# Patient Record
Sex: Female | Born: 1990 | Race: White | Hispanic: No | Marital: Married | Smoking: Never smoker
Health system: Southern US, Community
[De-identification: ages and names within clinical notes are randomized; demographics above are authoritative.]

## PROBLEM LIST (undated history)

## (undated) DIAGNOSIS — F419 Anxiety disorder, unspecified: Secondary | ICD-10-CM

## (undated) DIAGNOSIS — E039 Hypothyroidism, unspecified: Secondary | ICD-10-CM

## (undated) DIAGNOSIS — Z23 Encounter for immunization: Secondary | ICD-10-CM

## (undated) HISTORY — PX: WISDOM TOOTH EXTRACTION: SHX21

## (undated) HISTORY — DX: Hypothyroidism, unspecified: E03.9

## (undated) HISTORY — DX: Encounter for immunization: Z23

---
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
## 2015-12-01 ENCOUNTER — Ambulatory Visit: Payer: Self-pay | Admitting: Physician Assistant

## 2015-12-05 ENCOUNTER — Ambulatory Visit (INDEPENDENT_AMBULATORY_CARE_PROVIDER_SITE_OTHER): Payer: BLUE CROSS/BLUE SHIELD | Admitting: Physician Assistant

## 2015-12-05 ENCOUNTER — Encounter: Payer: Self-pay | Admitting: Physician Assistant

## 2015-12-05 VITALS — BP 110/70 | HR 91 | Temp 98.1°F | Resp 16 | Ht 61.0 in | Wt 138.8 lb

## 2015-12-05 DIAGNOSIS — L309 Dermatitis, unspecified: Secondary | ICD-10-CM | POA: Diagnosis not present

## 2015-12-05 DIAGNOSIS — Z136 Encounter for screening for cardiovascular disorders: Secondary | ICD-10-CM

## 2015-12-05 DIAGNOSIS — Z7189 Other specified counseling: Secondary | ICD-10-CM | POA: Diagnosis not present

## 2015-12-05 DIAGNOSIS — Z13220 Encounter for screening for lipoid disorders: Secondary | ICD-10-CM | POA: Diagnosis not present

## 2015-12-05 DIAGNOSIS — Z7689 Persons encountering health services in other specified circumstances: Secondary | ICD-10-CM

## 2015-12-05 MED ORDER — TRIAMCINOLONE ACETONIDE 0.1 % EX CREA: 1.0000 "application " | TOPICAL_CREAM | Freq: Two times a day (BID) | CUTANEOUS | Status: DC

## 2015-12-05 NOTE — Patient Instructions (Signed)
Eczema Eczema, also called atopic dermatitis, is a skin disorder that causes inflammation of the skin. It causes a red rash and dry, scaly skin. The skin becomes very itchy. Eczema is generally worse during the cooler winter months and often improves with the warmth of summer. Eczema usually starts showing signs in infancy. Some children outgrow eczema, but it may last through adulthood.  CAUSES  The exact cause of eczema is not known, but it appears to run in families. People with eczema often have a family history of eczema, allergies, asthma, or hay fever. Eczema is not contagious. Flare-ups of the condition may be caused by:   Contact with something you are sensitive or allergic to.   Stress. SIGNS AND SYMPTOMS  Dry, scaly skin.   Red, itchy rash.   Itchiness. This may occur before the skin rash and may be very intense.  DIAGNOSIS  The diagnosis of eczema is usually made based on symptoms and medical history. TREATMENT  Eczema cannot be cured, but symptoms usually can be controlled with treatment and other strategies. A treatment plan might include:  Controlling the itching and scratching.   Use over-the-counter antihistamines as directed for itching. This is especially useful at night when the itching tends to be worse.   Use over-the-counter steroid creams as directed for itching.   Avoid scratching. Scratching makes the rash and itching worse. It may also result in a skin infection (impetigo) due to a break in the skin caused by scratching.   Keeping the skin well moisturized with creams every day. This will seal in moisture and help prevent dryness. Lotions that contain alcohol and water should be avoided because they can dry the skin.   Limiting exposure to things that you are sensitive or allergic to (allergens).   Recognizing situations that cause stress.   Developing a plan to manage stress.  HOME CARE INSTRUCTIONS   Only take over-the-counter or  prescription medicines as directed by your health care provider.   Do not use anything on the skin without checking with your health care provider.   Keep baths or showers short (5 minutes) in warm (not hot) water. Use mild cleansers for bathing. These should be unscented. You may add nonperfumed bath oil to the bath water. It is best to avoid soap and bubble bath.   Immediately after a bath or shower, when the skin is still damp, apply a moisturizing ointment to the entire body. This ointment should be a petroleum ointment. This will seal in moisture and help prevent dryness. The thicker the ointment, the better. These should be unscented.   Keep fingernails cut short. Children with eczema may need to wear soft gloves or mittens at night after applying an ointment.   Dress in clothes made of cotton or cotton blends. Dress lightly, because heat increases itching.   A child with eczema should stay away from anyone with fever blisters or cold sores. The virus that causes fever blisters (herpes simplex) can cause a serious skin infection in children with eczema. SEEK MEDICAL CARE IF:   Your itching interferes with sleep.   Your rash gets worse or is not better within 1 week after starting treatment.   You see pus or soft yellow scabs in the rash area.   You have a fever.   You have a rash flare-up after contact with someone who has fever blisters.    This information is not intended to replace advice given to you by your health care   provider. Make sure you discuss any questions you have with your health care provider.   Document Released: 11/05/2000 Document Revised: 08/29/2013 Document Reviewed: 06/11/2013 Elsevier Interactive Patient Education 2016 Elsevier Inc.  

## 2015-12-05 NOTE — Progress Notes (Signed)
Patient: Angela Nunez, Female    DOB: [birth year], 25 y.o.   MRN: [id number] Visit Date: 12/05/2015  Today's Provider: Margaretann Loveless, PA-C   Chief Complaint  Patient presents with  . Establish Care   Subjective:    Annual physical exam Angela Nunez is a 25 y.o. female who presents today for establishing care as a new patient  She feels well. She reports not exercising. She reports she is sleeping well. She is a Holiday representative at [employer]. Patient is concern a rash on her hand. Patient wants blood work done today. Patient had her pap done 11/18/15 and breast exam at [facility]. Patient got her influenza vaccine at [facility]. 08/2015.   -----------------------------------------------------------------   Review of Systems  Constitutional: Negative.   HENT: Negative.   Eyes: Negative.   Respiratory: Negative.   Cardiovascular: Negative.   Gastrointestinal: Negative.   Endocrine: Negative.   Genitourinary: Negative.   Musculoskeletal: Negative.   Skin: Positive for rash.  Allergic/Immunologic: Negative.   Neurological: Negative.   Hematological: Negative.   Psychiatric/Behavioral: Negative.     Social History      She  reports that she has never smoked. She has never used smokeless tobacco. She reports that she drinks alcohol. She reports that she does not use illicit drugs.       Social History   Social History  . Marital Status: Single    Spouse Name: N/A  . Number of Children: N/A  . Years of Education: N/A   Social History Main Topics  . Smoking status: Never Smoker   . Smokeless tobacco: Never Used  . Alcohol Use: Yes     Comment: Once or twice a month  . Drug Use: No  . Sexual Activity: Not Asked   Other Topics Concern  . None   Social History Narrative  . None    History reviewed. No pertinent past medical history.   There are no active problems to display for this patient.   History reviewed. No  pertinent past surgical history.  Family History        Family Status  Relation Status Death Age  . Mother Alive   . Father Alive         Her family history is not on file.    No Known Allergies  Previous Medications   SPRINTEC 28 0.25-35 MG-MCG TABLET    Take 1 tablet by mouth daily.    Patient Care Team: Margaretann Loveless, PA-C as PCP - General (Family Medicine)     Objective:   Vitals: BP 110/70 mmHg  Pulse 91  Temp(Src) 98.1 F (36.7 C) (Oral)  Resp 16  Ht 5\' 1"  (1.549 m)  Wt 138 lb 12.8 oz (62.959 kg)  BMI 26.24 kg/m2  LMP 11/19/2015   Physical Exam  Constitutional: She is oriented to person, place, and time. She appears well-developed and well-nourished. No distress.  HENT:  Head: Normocephalic and atraumatic.  Right Ear: External ear normal.  Left Ear: External ear normal.  Nose: Nose normal.  Mouth/Throat: Oropharynx is clear and moist. No oropharyngeal exudate.  Eyes: Conjunctivae and EOM are normal. Pupils are equal, round, and reactive to light. Right eye exhibits no discharge. Left eye exhibits no discharge. No scleral icterus.  Neck: Normal range of motion. Neck supple. No JVD present. No tracheal deviation present. No thyromegaly present.  Cardiovascular: Normal rate, regular rhythm, normal heart sounds and intact distal pulses.  Exam reveals no gallop and no friction rub.   No murmur heard. Pulmonary/Chest: Effort normal and breath sounds normal. No respiratory distress. She has no wheezes. She has no rales. She exhibits no tenderness.  Abdominal: Soft. Bowel sounds are normal. She exhibits no distension and no mass. There is no tenderness. There is no rebound and no guarding.  Musculoskeletal: Normal range of motion. She exhibits no edema or tenderness.  Lymphadenopathy:    She has no cervical adenopathy.  Neurological: She is alert and oriented to person, place, and time.  Skin: Skin is warm and dry. Rash noted. She is not diaphoretic.  Areas of  red, scaly patches that itch on occasion located over MTP joint 3rd finger left hand, MTP joint 3rd finger right hand, and left foot.  Psychiatric: She has a normal mood and affect. Her behavior is normal. Judgment and thought content normal.  Vitals reviewed.    Depression Screen No flowsheet data found.    Assessment & Plan:     Routine Health Maintenance and Physical Exam  1. Establishing care with new doctor, encounter for Pap and [facility]. Physical exam today was normal with exception of eczema. Will check labs as below.  I will follow up with her pending lab results. She is to call the office in the meantime if she has any acute issues, questions or concerns. - CBC With Differential - TSH - Comprehensive Metabolic Panel (CMET)  2. Encounter for lipid screening for cardiovascular disease Family history of high cholesterol.  Will check labs as below. I will follow up with her pending lab results.  - Lipid panel  3. Eczema Will give triamcinolone cream for her to use with flares.  Advised she can use for two weeks at a time. She is to use a good lotion in between such as lubriderm or eucerin. She is to call the office if symptoms worsen. - triamcinolone cream (KENALOG) 0.1 %; Apply 1 application topically 2 (two) times daily.  Dispense: 80 g; Refill: 0   Exercise Activities and Dietary recommendations Goals    None      Immunization History  Administered Date(s) Administered  . Influenza-Unspecified 09/03/2015    Health Maintenance  Topic Date Due  . HIV Screening  05/14/2008  . TETANUS/TDAP  05/14/2012  . PAP SMEAR  05/14/2014  . INFLUENZA VACCINE  06/22/2016      Discussed health benefits of physical activity, and encouraged her to engage in regular exercise appropriate for her age and condition.    --------------------------------------------------------------------

## 2015-12-06 LAB — COMPREHENSIVE METABOLIC PANEL
A/G RATIO: 1.5 (ref 1.1–2.5)
ALBUMIN: 4.5 g/dL (ref 3.5–5.5)
ALK PHOS: 80 IU/L (ref 39–117)
ALT: 17 IU/L (ref 0–32)
AST: 20 IU/L (ref 0–40)
BILIRUBIN TOTAL: 0.8 mg/dL (ref 0.0–1.2)
BUN / CREAT RATIO: 11 (ref 8–20)
BUN: 10 mg/dL (ref 6–20)
CO2: 25 mmol/L (ref 18–29)
Calcium: 10.3 mg/dL — ABNORMAL HIGH (ref 8.7–10.2)
Chloride: 101 mmol/L (ref 96–106)
Creatinine, Ser: 0.9 mg/dL (ref 0.57–1.00)
GFR calc Af Amer: 105 mL/min/{1.73_m2} (ref >59)
GFR calc non Af Amer: 91 mL/min/{1.73_m2} (ref >59)
GLOBULIN, TOTAL: 3.1 g/dL (ref 1.5–4.5)
Glucose: 87 mg/dL (ref 65–99)
Potassium: 5.3 mmol/L — ABNORMAL HIGH (ref 3.5–5.2)
Sodium: 143 mmol/L (ref 134–144)
Total Protein: 7.6 g/dL (ref 6.0–8.5)

## 2015-12-06 LAB — CBC WITH DIFFERENTIAL
BASOS ABS: 0 10*3/uL (ref 0.0–0.2)
Basos: 1 %
EOS (ABSOLUTE): 0.2 10*3/uL (ref 0.0–0.4)
EOS: 2 %
HEMATOCRIT: 41.4 % (ref 34.0–46.6)
HEMOGLOBIN: 14.4 g/dL (ref 11.1–15.9)
IMMATURE GRANS (ABS): 0 10*3/uL (ref 0.0–0.1)
IMMATURE GRANULOCYTES: 0 %
LYMPHS: 32 %
Lymphocytes Absolute: 2.7 10*3/uL (ref 0.7–3.1)
MCH: 29.1 pg (ref 26.6–33.0)
MCHC: 34.8 g/dL (ref 31.5–35.7)
MCV: 84 fL (ref 79–97)
MONOCYTES: 6 %
Monocytes Absolute: 0.5 10*3/uL (ref 0.1–0.9)
NEUTROS ABS: 5.2 10*3/uL (ref 1.4–7.0)
Neutrophils: 59 %
RBC: 4.95 x10E6/uL (ref 3.77–5.28)
RDW: 12.2 % — ABNORMAL LOW (ref 12.3–15.4)
WBC: 8.7 10*3/uL (ref 3.4–10.8)

## 2015-12-06 LAB — LIPID PANEL
CHOL/HDL RATIO: 3.7 ratio (ref 0.0–4.4)
CHOLESTEROL TOTAL: 245 mg/dL — AB (ref 100–199)
HDL: 66 mg/dL (ref >39)
LDL CALC: 145 mg/dL — AB (ref 0–99)
TRIGLYCERIDES: 169 mg/dL — AB (ref 0–149)
VLDL CHOLESTEROL CAL: 34 mg/dL (ref 5–40)

## 2015-12-06 LAB — TSH: TSH: 5.56 u[IU]/mL — ABNORMAL HIGH (ref 0.450–4.500)

## 2015-12-08 ENCOUNTER — Telehealth: Payer: Self-pay

## 2015-12-08 NOTE — Telephone Encounter (Signed)
-----   Message from Margaretann LovelessJennifer M Burnette, PA-C sent at 12/08/2015  8:38 AM EST ----- Cholesterol was slightly elevated.  This could be because the lab was non-fasting.  I do recommend watching your diet and trying to limit high fat, high cholesterol foods. Also making sure to stay physically active with adding moderate exercise 30-40 minutes 3-4 days per week. You could also add fish oil 1200 mg twice daily or red yeast rice as a natural supplement to decrease cholesterol. Also your TSH was slightly elevated which could cause some fatigue.  It could have been a fluke reading as well as TSH has a tendency to change quickly.  I do recommend retesting a complete thyroid panel in 8-12 weeks.  All other labs can be checked in one year.

## 2015-12-08 NOTE — Telephone Encounter (Signed)
LMTCB

## 2015-12-08 NOTE — Telephone Encounter (Signed)
Patient advised as directed below. Per patient will call later to schedule appointment.  Thanks, -Kenyia Wambolt

## 2016-02-05 ENCOUNTER — Ambulatory Visit (INDEPENDENT_AMBULATORY_CARE_PROVIDER_SITE_OTHER): Payer: BLUE CROSS/BLUE SHIELD | Admitting: Physician Assistant

## 2016-02-05 ENCOUNTER — Encounter: Payer: Self-pay | Admitting: Physician Assistant

## 2016-02-05 VITALS — BP 112/60 | Temp 98.1°F | Resp 16 | Ht 61.0 in | Wt 143.0 lb

## 2016-02-05 DIAGNOSIS — E039 Hypothyroidism, unspecified: Secondary | ICD-10-CM | POA: Diagnosis not present

## 2016-02-05 DIAGNOSIS — R7989 Other specified abnormal findings of blood chemistry: Secondary | ICD-10-CM | POA: Diagnosis not present

## 2016-02-05 NOTE — Patient Instructions (Signed)

## 2016-02-05 NOTE — Progress Notes (Addendum)
Patient ID: Angela Nunez, female   DOB: 01-16-1991, 25 y.o.   MRN: [id number]       Patient: Angela Nunez Female    DOB: 01-16-1991   24 y.o.   MRN: [id number] Visit Date: 02/05/2016  Today's Provider: Margaretann LovelessJennifer M Jesper Stirewalt, PA-C   Chief Complaint  Patient presents with  . Thyroid Problem    6 week F/U.    Subjective:    Thyroid Problem Presents for follow-up visit. Symptoms include fatigue. Patient reports no diaphoresis. Past treatments include nothing. There are no known risk factors.  Patient is here to F/U on elevated TSH that was drawn on 12/08/15. Patient reports that she still feels fatigued. However, it is not more than her "usual". Patient denies any hair loss, anxiety, depressed mood, or weight changes.      No Known Allergies Previous Medications   SPRINTEC 28 0.25-35 MG-MCG TABLET    Take 1 tablet by mouth daily.   TRIAMCINOLONE CREAM (KENALOG) 0.1 %    Apply 1 application topically 2 (two) times daily.    Review of Systems  Constitutional: Positive for fatigue. Negative for fever, chills, diaphoresis, activity change, appetite change and unexpected weight change.  Respiratory: Negative.   Cardiovascular: Negative.   Endocrine: Negative.   Neurological: Negative.   Psychiatric/Behavioral: Negative.     Social History  Substance Use Topics  . Smoking status: Never Smoker   . Smokeless tobacco: Never Used  . Alcohol Use: Yes     Comment: Once or twice a month   Objective:   There were no vitals taken for this visit.  Physical Exam  Constitutional: She appears well-developed and well-nourished. No distress.  Neck: Normal range of motion. Neck supple. No JVD present. No tracheal deviation present. No thyromegaly present.  Cardiovascular: Normal rate, regular rhythm and normal heart sounds.  Exam reveals no gallop and no friction rub.   No murmur heard. Pulmonary/Chest: Effort normal and breath sounds normal. No respiratory distress. She has no  wheezes. She has no rales.  Lymphadenopathy:    She has no cervical adenopathy.  Skin: She is not diaphoretic.  Vitals reviewed.       Assessment & Plan:     1. Abnormal TSH Will recheck thyroid panel as below. If TSH still abnormal will start levothyroxine and recheck in 3 months. - Thyroid Panel With TSH  Addendum: 1. Hypothyroidism, unspecified hypothyroidism type TSH is still elevated and has worsened. Will add levothyroxine and recheck in 3 months. - levothyroxine (SYNTHROID, LEVOTHROID) 25 MCG tablet; Take 1 tablet (25 mcg total) by mouth daily before breakfast.  Dispense: 90 tablet; Refill: 0       Margaretann LovelessJennifer M Shenelle Klas, PA-C  [facility]

## 2016-02-06 ENCOUNTER — Telehealth: Payer: Self-pay | Admitting: Physician Assistant

## 2016-02-06 DIAGNOSIS — Z8639 Personal history of other endocrine, nutritional and metabolic disease: Secondary | ICD-10-CM | POA: Insufficient documentation

## 2016-02-06 DIAGNOSIS — E039 Hypothyroidism, unspecified: Secondary | ICD-10-CM

## 2016-02-06 LAB — THYROID PANEL WITH TSH
Free Thyroxine Index: 1.8 (ref 1.2–4.9)
T3 UPTAKE RATIO: 18 % — AB (ref 24–39)
T4, Total: 10.1 ug/dL (ref 4.5–12.0)
TSH: 6.94 u[IU]/mL — ABNORMAL HIGH (ref 0.450–4.500)

## 2016-02-06 MED ORDER — LEVOTHYROXINE SODIUM 25 MCG PO TABS: 25.0000 ug | ORAL_TABLET | Freq: Every day | ORAL | Status: DC

## 2016-02-06 NOTE — Addendum Note (Signed)
Addended by: Margaretann LovelessBURNETTE, Hadlee Burback M on: 02/06/2016 10:07 AM   Modules accepted: Orders

## 2016-02-06 NOTE — Telephone Encounter (Signed)
Pt advised of lab results-aa 

## 2016-02-06 NOTE — Progress Notes (Signed)
LMTCB

## 2016-02-06 NOTE — Telephone Encounter (Signed)
Pt is returning call.  RU#[phone]/[id number]#[phone]/MW

## 2016-04-30 ENCOUNTER — Telehealth: Payer: Self-pay | Admitting: Physician Assistant

## 2016-04-30 DIAGNOSIS — E039 Hypothyroidism, unspecified: Secondary | ICD-10-CM

## 2016-04-30 MED ORDER — LEVOTHYROXINE SODIUM 25 MCG PO TABS: 25.0000 ug | ORAL_TABLET | Freq: Every day | ORAL | Status: DC

## 2016-04-30 NOTE — Telephone Encounter (Signed)
Pt is leaving to go out of town and will gone until the 16 th.  She will run out of her thyroid medication on the 17 th.  She has an appt to see you for her thyroid on Wednesday the 21st.  She wants to know will it be ok to miss those days of medication or does she need to get a refill before she comes back in.  If she does just call into [facility] and she will pick it up next Saturday when she gets back.  [facility] [address]  Thanks, Barth Kirkseri

## 2016-04-30 NOTE — Telephone Encounter (Signed)
Pt informed via VM

## 2016-04-30 NOTE — Telephone Encounter (Signed)
I will send in refill

## 2016-05-07 ENCOUNTER — Ambulatory Visit: Payer: BLUE CROSS/BLUE SHIELD | Admitting: Physician Assistant

## 2016-05-12 ENCOUNTER — Encounter: Payer: Self-pay | Admitting: Physician Assistant

## 2016-05-12 ENCOUNTER — Ambulatory Visit (INDEPENDENT_AMBULATORY_CARE_PROVIDER_SITE_OTHER): Payer: BLUE CROSS/BLUE SHIELD | Admitting: Physician Assistant

## 2016-05-12 VITALS — BP 120/82 | HR 90 | Temp 98.1°F | Resp 16 | Wt 145.8 lb

## 2016-05-12 DIAGNOSIS — E039 Hypothyroidism, unspecified: Secondary | ICD-10-CM | POA: Diagnosis not present

## 2016-05-12 NOTE — Patient Instructions (Signed)

## 2016-05-12 NOTE — Progress Notes (Signed)
       Patient: Angela Nunez Female    DOB: 12/30/1990   24 y.o.   MRN: [id number] Visit Date: 05/12/2016  Today's Provider: Margaretann LovelessJennifer M Burnette, PA-C   Chief Complaint  Patient presents with  . Follow-up    Hypothyroidism   Subjective:    HPI  Hypothyroid, follow-up:  TSH  Date Value Ref Range Status  02/05/2016 6.940* 0.450 - 4.500 uIU/mL Final  12/05/2015 5.560* 0.450 - 4.500 uIU/mL Final   Wt Readings from Last 3 Encounters:  05/12/16 145 lb 12.8 oz (66.134 kg)  02/05/16 143 lb (64.864 kg)  12/05/15 138 lb 12.8 oz (62.959 kg)    She was last seen for hypothyroid 3 months ago.  Management since that visit includes Levothyroxine 25 MCG. She reports good compliance with treatment. She is not having side effects.  She is not exercising. She is experiencing none She denies change in energy level, diarrhea, heat / cold intolerance, nervousness, palpitations and weight changes Weight trend: fluctuating a bit  ------------------------------------------------------------------------     No Known Allergies Current Meds  Medication Sig  . levothyroxine (SYNTHROID, LEVOTHROID) 25 MCG tablet Take 1 tablet (25 mcg total) by mouth daily before breakfast.  . SPRINTEC 28 0.25-35 MG-MCG tablet Take 1 tablet by mouth daily.  Marland Kitchen. triamcinolone cream (KENALOG) 0.1 % Apply 1 application topically 2 (two) times daily.    Review of Systems  Constitutional: Negative for fatigue.  Respiratory: Negative for chest tightness and shortness of breath.   Cardiovascular: Negative for chest pain, palpitations and leg swelling.  Endocrine: Negative for cold intolerance and heat intolerance.  Neurological: Negative for weakness.    Social History  Substance Use Topics  . Smoking status: Never Smoker   . Smokeless tobacco: Never Used  . Alcohol Use: Yes     Comment: Once or twice a month   Objective:   BP 120/82 mmHg  Pulse 90  Temp(Src) 98.1 F (36.7 C) (Oral)  Resp 16  Wt  145 lb 12.8 oz (66.134 kg)  LMP 05/05/2016  Physical Exam  Constitutional: She appears well-developed and well-nourished. No distress.  Neck: Normal range of motion. Neck supple. No tracheal deviation present. No thyromegaly present.  Cardiovascular: Normal rate, regular rhythm and normal heart sounds.  Exam reveals no gallop and no friction rub.   No murmur heard. Pulmonary/Chest: Effort normal and breath sounds normal. No respiratory distress. She has no wheezes. She has no rales.  Lymphadenopathy:    She has no cervical adenopathy.  Skin: She is not diaphoretic.  Vitals reviewed.       Assessment & Plan:     1. Hypothyroidism, unspecified hypothyroidism type Will recheck labs and f/u pending lab results. If stable will continue levothyroxine 25mcg. I will see her back in 6 months to recheck. - Thyroid Panel With TSH  The entirety of the information documented in the History of Present Illness, Review of Systems and Physical Exam were personally obtained by me. Portions of this information were initially documented by Angela Nunez, CMA and reviewed by me for thoroughness and accuracy.       Margaretann LovelessJennifer M Burnette, PA-C  [facility]

## 2016-05-13 LAB — THYROID PANEL WITH TSH
Free Thyroxine Index: 1.9 (ref 1.2–4.9)
T3 Uptake Ratio: 20 % — ABNORMAL LOW (ref 24–39)
T4 TOTAL: 9.7 ug/dL (ref 4.5–12.0)
TSH: 2.02 u[IU]/mL (ref 0.450–4.500)

## 2016-06-05 ENCOUNTER — Other Ambulatory Visit: Payer: Self-pay | Admitting: Physician Assistant

## 2016-06-05 DIAGNOSIS — E039 Hypothyroidism, unspecified: Secondary | ICD-10-CM

## 2016-06-07 MED ORDER — LEVOTHYROXINE SODIUM 25 MCG PO TABS: 25.0000 ug | ORAL_TABLET | Freq: Every day | ORAL | Status: DC

## 2016-07-20 DIAGNOSIS — H52222 Regular astigmatism, left eye: Secondary | ICD-10-CM | POA: Diagnosis not present

## 2016-09-17 DIAGNOSIS — Z23 Encounter for immunization: Secondary | ICD-10-CM | POA: Diagnosis not present

## 2016-11-08 ENCOUNTER — Other Ambulatory Visit: Payer: Self-pay

## 2016-11-08 DIAGNOSIS — E039 Hypothyroidism, unspecified: Secondary | ICD-10-CM

## 2016-11-08 MED ORDER — LEVOTHYROXINE SODIUM 25 MCG PO TABS: 25.0000 ug | ORAL_TABLET | Freq: Every day | ORAL | 6 refills | Status: DC

## 2016-11-08 NOTE — Telephone Encounter (Signed)
Pharmacy requesting refill. NOTE: can medication be changed for [facility] to [employer]

## 2016-12-22 DIAGNOSIS — Z309 Encounter for contraceptive management, unspecified: Secondary | ICD-10-CM | POA: Diagnosis not present

## 2016-12-22 DIAGNOSIS — Z124 Encounter for screening for malignant neoplasm of cervix: Secondary | ICD-10-CM | POA: Diagnosis not present

## 2016-12-22 DIAGNOSIS — Z01419 Encounter for gynecological examination (general) (routine) without abnormal findings: Secondary | ICD-10-CM | POA: Diagnosis not present

## 2016-12-22 DIAGNOSIS — Z3041 Encounter for surveillance of contraceptive pills: Secondary | ICD-10-CM | POA: Diagnosis not present

## 2016-12-22 DIAGNOSIS — Z113 Encounter for screening for infections with a predominantly sexual mode of transmission: Secondary | ICD-10-CM | POA: Diagnosis not present

## 2016-12-22 LAB — HM PAP SMEAR: HM PAP: NORMAL

## 2017-02-10 DIAGNOSIS — L309 Dermatitis, unspecified: Secondary | ICD-10-CM | POA: Diagnosis not present

## 2017-07-01 ENCOUNTER — Ambulatory Visit (INDEPENDENT_AMBULATORY_CARE_PROVIDER_SITE_OTHER): Payer: BLUE CROSS/BLUE SHIELD | Admitting: Physician Assistant

## 2017-07-01 ENCOUNTER — Encounter: Payer: Self-pay | Admitting: Physician Assistant

## 2017-07-01 VITALS — BP 140/80 | HR 101 | Temp 98.0°F | Resp 16 | Ht 61.0 in | Wt 158.0 lb

## 2017-07-01 DIAGNOSIS — E039 Hypothyroidism, unspecified: Secondary | ICD-10-CM | POA: Diagnosis not present

## 2017-07-01 DIAGNOSIS — Z20820 Contact with and (suspected) exposure to varicella: Secondary | ICD-10-CM | POA: Diagnosis not present

## 2017-07-01 DIAGNOSIS — Z23 Encounter for immunization: Secondary | ICD-10-CM

## 2017-07-01 MED ORDER — LEVOTHYROXINE SODIUM 25 MCG PO TABS: 25.0000 ug | ORAL_TABLET | Freq: Every day | ORAL | 1 refills | Status: DC

## 2017-07-01 NOTE — Patient Instructions (Signed)
Hypothyroidism Hypothyroidism is a disorder of the thyroid. The thyroid is a large gland that is located in the lower front of the neck. The thyroid releases hormones that control how the body works. With hypothyroidism, the thyroid does not make enough of these hormones. What are the causes? Causes of hypothyroidism may include:  Viral infections.  Pregnancy.  Your own defense system (immune system) attacking your thyroid.  Certain medicines.  Birth defects.  Past radiation treatments to your head or neck.  Past treatment with radioactive iodine.  Past surgical removal of part or all of your thyroid.  Problems with the gland that is located in the center of your brain (pituitary).  What are the signs or symptoms? Signs and symptoms of hypothyroidism may include:  Feeling as though you have no energy (lethargy).  Inability to tolerate cold.  Weight gain that is not explained by a change in diet or exercise habits.  Dry skin.  Coarse hair.  Menstrual irregularity.  Slowing of thought processes.  Constipation.  Sadness or depression.  How is this diagnosed? Your health care provider may diagnose hypothyroidism with blood tests and ultrasound tests. How is this treated? Hypothyroidism is treated with medicine that replaces the hormones that your body does not make. After you begin treatment, it may take several weeks for symptoms to go away. Follow these instructions at home:  Take medicines only as directed by your health care provider.  If you start taking any new medicines, tell your health care provider.  Keep all follow-up visits as directed by your health care provider. This is important. As your condition improves, your dosage needs may change. You will need to have blood tests regularly so that your health care provider can watch your condition. Contact a health care provider if:  Your symptoms do not get better with treatment.  You are taking thyroid  replacement medicine and: ? You sweat excessively. ? You have tremors. ? You feel anxious. ? You lose weight rapidly. ? You cannot tolerate heat. ? You have emotional swings. ? You have diarrhea. ? You feel weak. Get help right away if:  You develop chest pain.  You develop an irregular heartbeat.  You develop a rapid heartbeat. This information is not intended to replace advice given to you by your health care provider. Make sure you discuss any questions you have with your health care provider. Document Released: 11/08/2005 Document Revised: 04/15/2016 Document Reviewed: 03/26/2014 Elsevier Interactive Patient Education  2017 Elsevier Inc.  

## 2017-07-01 NOTE — Progress Notes (Signed)
Patient: Angela Nunez Female    DOB: [birth year]   26 y.o.   MRN: [id number] Visit Date: 07/01/2017  Today's Provider: Margaretann Loveless, PA-C   Chief Complaint  Patient presents with  . Hypothyroidism   Subjective:    HPI  Hypothyroid, follow-up:  TSH  Date Value Ref Range Status  05/12/2016 2.020 0.450 - 4.500 uIU/mL Final  02/05/2016 6.940 (H) 0.450 - 4.500 uIU/mL Final  12/05/2015 5.560 (H) 0.450 - 4.500 uIU/mL Final   Wt Readings from Last 3 Encounters:  07/01/17 158 lb (71.7 kg)  05/12/16 145 lb 12.8 oz (66.1 kg)  02/05/16 143 lb (64.9 kg)    She was last seen for hypothyroid 1 years ago.  Management since that visit includes check labs. She reports excellent compliance with treatment. She is not having side effects.  She is not exercising. She is experiencing none She denies change in energy level, heat / cold intolerance and palpitations Weight trend: stable ------------------------------------------------------------------------     No Known Allergies   Current Outpatient Prescriptions:  .  levothyroxine (SYNTHROID, LEVOTHROID) 25 MCG tablet, Take 1 tablet (25 mcg total) by mouth daily before breakfast., Disp: 30 tablet, Rfl: 6 .  SPRINTEC 28 0.25-35 MG-MCG tablet, Take 1 tablet by mouth daily., Disp: , Rfl: 0  Review of Systems  Constitutional: Negative.   Respiratory: Negative.   Cardiovascular: Negative.   Endocrine: Negative.   Genitourinary: Negative.     Social History  Substance Use Topics  . Smoking status: Never Smoker  . Smokeless tobacco: Never Used  . Alcohol use Yes     Comment: Once or twice a month   Objective:   BP 140/80 (BP Location: Left Arm, Patient Position: Sitting, Cuff Size: Large)   Pulse (!) 101   Temp 98 F (36.7 C) (Oral)   Resp 16   Ht 5\' 1"  (1.549 m)   Wt 158 lb (71.7 kg)   LMP 07/01/2017 (Exact Date)   SpO2 98%   BMI 29.85 kg/m  Vitals:   07/01/17 1534  BP: 140/80  Pulse: (!) 101  Resp:  16  Temp: 98 F (36.7 C)  TempSrc: Oral  SpO2: 98%  Weight: 158 lb (71.7 kg)  Height: 5\' 1"  (1.549 m)     Physical Exam  Constitutional: She appears well-developed and well-nourished. No distress.  Neck: Normal range of motion. Neck supple. No thyromegaly present.  Cardiovascular: Normal rate, regular rhythm and normal heart sounds.  Exam reveals no gallop and no friction rub.   No murmur heard. Pulmonary/Chest: Effort normal and breath sounds normal. No respiratory distress. She has no wheezes. She has no rales.  Skin: She is not diaphoretic.  Vitals reviewed.      Assessment & Plan:     1. Hypothyroidism, unspecified type Stable. Diagnosis pulled for medication refill. Continue current medical treatment plan. Will check labs as below and f/u pending results. - TSH - T4 - levothyroxine (SYNTHROID, LEVOTHROID) 25 MCG tablet; Take 1 tablet (25 mcg total) by mouth daily before breakfast.  Dispense: 90 tablet; Refill: 1  2. Need for Td vaccine Td Vaccine given to patient without complications. Patient sat for 15 minutes after administration and was tolerated well without adverse effects. - Td : Tetanus/diphtheria >7yo Preservative  free  3. History of exposure to varicella Patient had varicella as a child but was then given one dose of varicella vaccine. She never received the second dose. Will check titer as below  to see if second dose is necessary. - Varicella Zoster Abs, IgG/IgM       Margaretann LovelessJennifer M Emmersyn Kratzke, PA-C  [facility]

## 2017-07-04 ENCOUNTER — Telehealth: Payer: Self-pay

## 2017-07-04 LAB — VARICELLA ZOSTER ABS, IGG/IGM: VARICELLA: 1015 {index} (ref >165)

## 2017-07-04 LAB — TSH: TSH: 2.35 u[IU]/mL (ref 0.450–4.500)

## 2017-07-04 LAB — T4: T4, Total: 11.2 ug/dL (ref 4.5–12.0)

## 2017-07-04 NOTE — Telephone Encounter (Signed)
[email or web address]

## 2017-07-04 NOTE — Telephone Encounter (Signed)
Patient advised as below.  

## 2017-07-04 NOTE — Telephone Encounter (Signed)
-----   Message from Margaretann LovelessJennifer M Burnette, PA-C sent at 07/04/2017  8:25 AM EDT ----- Thyroid lab is WNL. Varicella titer is positive so no need to repeat vaccination.

## 2017-09-13 DIAGNOSIS — Z23 Encounter for immunization: Secondary | ICD-10-CM | POA: Diagnosis not present

## 2017-12-06 ENCOUNTER — Ambulatory Visit (INDEPENDENT_AMBULATORY_CARE_PROVIDER_SITE_OTHER): Payer: BLUE CROSS/BLUE SHIELD | Admitting: Physician Assistant

## 2017-12-06 ENCOUNTER — Encounter: Payer: Self-pay | Admitting: Physician Assistant

## 2017-12-06 VITALS — BP 156/86 | HR 108 | Temp 98.8°F | Resp 16 | Wt 156.0 lb

## 2017-12-06 DIAGNOSIS — R55 Syncope and collapse: Secondary | ICD-10-CM

## 2017-12-06 DIAGNOSIS — E039 Hypothyroidism, unspecified: Secondary | ICD-10-CM | POA: Diagnosis not present

## 2017-12-06 LAB — POCT URINE PREGNANCY: Preg Test, Ur: NEGATIVE

## 2017-12-06 NOTE — Progress Notes (Signed)
Patient: Angela Nunez Female    DOB: 1991/09/01   27 y.o.   MRN: [id number] Visit Date: 12/06/2017  Today's Provider: Trey Sailors, PA-C   Chief Complaint  Patient presents with  . Loss of Consciousness   Subjective:    Angela Nunez is a 27 y/o woman presenting today after she passed out over the weekend. She report that Friday night she was watching TV and she stood up to go to the bathroom. She says she got a "weird feeling" and then sat down on the cough. She says the next thing she knew, she woke up with her boyfriend in front of her. Denies hunger, dehydration. Denies tachycardia or palpitations at the time. Denies tunnel vision or heated feeling. She reports her boyfriend said she was unresponsive for a couple of minutes. He says she made a snoring like sound. She did lose control of her bladder. She had one episode of vomiting one hour later. No tongue biting. Her boyfriend told her both of her arms curled into her body, but that there was no jerking or shaking. She did not have a prolonged period of confusion or agitation after this episode. She denies any headaches, vision loss, confusion, inability to speak, difficulty walking. She reports at one time 8 years ago she fell against a wall and had seizure like activity. She went to the ER with a normal head CT, but was not further evaluated for seizures by any neurologist or with EEG.   She does have a history of hypothyroidism and is noncompliant with her medication.  Her LMP was two weeks ago, she is sexually active on birth control. She took a home pregnancy test which was negative.   She reports she is under a lot of stress currently with her boyfriends job and going through the process of buying a house.   Loss of Consciousness  This is a new problem. The current episode started in the past 7 days. The problem has been unchanged. She lost consciousness for a period of less than 1 minute. Associated symptoms  include malaise/fatigue. Pertinent negatives include no abdominal pain, back pain, bladder incontinence, bowel incontinence, chest pain, clumsiness, confusion, diaphoresis, dizziness, fever, focal sensory loss, focal weakness, headaches, light-headedness, nausea, palpitations, slurred speech, visual change or weakness.     No Known Allergies   Current Outpatient Medications:  .  levothyroxine (SYNTHROID, LEVOTHROID) 25 MCG tablet, Take 1 tablet (25 mcg total) by mouth daily before breakfast., Disp: 90 tablet, Rfl: 1 .  SPRINTEC 28 0.25-35 MG-MCG tablet, Take 1 tablet by mouth daily., Disp: , Rfl: 0  Review of Systems  Constitutional: Positive for fatigue and malaise/fatigue. Negative for activity change, appetite change, chills, diaphoresis, fever and unexpected weight change.  Respiratory: Negative.   Cardiovascular: Positive for syncope. Negative for chest pain, palpitations and leg swelling.  Gastrointestinal: Negative.  Negative for abdominal pain, bowel incontinence and nausea.  Genitourinary: Negative for bladder incontinence, decreased urine volume, frequency, hematuria, vaginal bleeding, vaginal discharge and vaginal pain.  Musculoskeletal: Negative for back pain.  Neurological: Positive for syncope. Negative for dizziness, tremors, focal weakness, seizures, facial asymmetry, speech difficulty, weakness, light-headedness, numbness and headaches.  Hematological: Does not bruise/bleed easily.  Psychiatric/Behavioral: Negative for confusion.    Social History   Tobacco Use  . Smoking status: Never Smoker  . Smokeless tobacco: Never Used  Substance Use Topics  . Alcohol use: Yes    Comment: Once or twice a  month   Objective:   BP (!) 156/86 (BP Location: Right Arm, Patient Position: Sitting, Cuff Size: Normal)   Pulse (!) 108   Temp 98.8 F (37.1 C) (Oral)   Resp 16   Wt 156 lb (70.8 kg)   LMP 11/22/2017   BMI 29.48 kg/m  Vitals:   12/06/17 1208  BP: (!) 156/86    Pulse: (!) 108  Resp: 16  Temp: 98.8 F (37.1 C)  TempSrc: Oral  Weight: 156 lb (70.8 kg)     Physical Exam  Constitutional: She is oriented to person, place, and time. She appears well-developed and well-nourished.  HENT:  Right Ear: External ear normal.  Mouth/Throat: Oropharynx is clear and moist. No oropharyngeal exudate.  Eyes: Conjunctivae are normal. Pupils are equal, round, and reactive to light.  Neck: Neck supple.  Cardiovascular: Normal rate and regular rhythm.  Pulmonary/Chest: Effort normal and breath sounds normal.  Abdominal: Soft. Bowel sounds are normal.  Lymphadenopathy:    She has no cervical adenopathy.  Neurological: She is alert and oriented to person, place, and time. She has normal strength. She displays normal reflexes. No cranial nerve deficit. She exhibits normal muscle tone. Coordination and gait normal. GCS eye subscore is 4. GCS verbal subscore is 5. GCS motor subscore is 6.  Skin: Skin is warm and dry.  Psychiatric: She has a normal mood and affect. Her behavior is normal.        Assessment & Plan:     1. Syncope, unspecified syncope type  Will check labs as below. Presentation does not appear consistent with cardiac cause of loss of consciousness. She is hypothyroid and not compliant with synthroid, so will check that. Her urine pregnancy is normal. Her description of events, while incomplete as she does not come with anybody who witnessed the event today, does raise some concern for possible generalized seizure with loss of consciousness, possibly some tonic movements of her arms, noisy breathing, and incontinence. Other causes may include vasovagal syncope, complex migraine. Low suspicion for stroke or bleed. I suggest we refer her to neurology for further workup. She declines this at this point because she hasn't told her parents about this event and she is on their insurance. She will call back if she would like to be referred.    - TSH - CBC  With Differential - Comprehensive Metabolic Panel (CMET) - POCT urine pregnancy  2. Hypothyroidism, unspecified type  - TSH  Return if symptoms worsen or fail to improve.  The entirety of the information documented in the History of Present Illness, Review of Systems and Physical Exam were personally obtained by me. Portions of this information were initially documented by Kavin LeechLaura Walsh, CMA and reviewed by me for thoroughness and accuracy.   I have spent 25 minutes with this patient, >50% of which was spent on counseling and coordination of care.       Trey SailorsAdriana M Othell Jaime, PA-C  [facility]

## 2017-12-06 NOTE — Patient Instructions (Signed)
Syncope Syncope is when you lose temporarily pass out (faint). Signs that you may be about to pass out include:  Feeling dizzy or light-headed.  Feeling sick to your stomach (nauseous).  Seeing all white or all black.  Having cold, clammy skin.  If you passed out, get help right away. Call your local emergency services (911 in the [place] Do not drive yourself to the hospital. Follow these instructions at home: Pay attention to any changes in your symptoms. Take these actions to help with your condition:  Have someone stay with you until you feel stable.  Do not drive, use machinery, or play sports until your doctor says it is okay.  Keep all follow-up visits as told by your doctor. This is important.  If you start to feel like you might pass out, lie down right away and raise (elevate) your feet above the level of your heart. Breathe deeply and steadily. Wait until all of the symptoms are gone.  Drink enough fluid to keep your pee (urine) clear or pale yellow.  If you are taking blood pressure or heart medicine, get up slowly and spend many minutes getting ready to sit and then stand. This can help with dizziness.  Take over-the-counter and prescription medicines only as told by your doctor.  Get help right away if:  You have a very bad headache.  You have unusual pain in your chest, tummy, or back.  You are bleeding from your mouth or rectum.  You have black or tarry poop (stool).  You have a very fast or uneven heartbeat (palpitations).  It hurts to breathe.  You pass out once or more than once.  You have jerky movements that you cannot control (seizure).  You are confused.  You have trouble walking.  You are very weak.  You have vision problems. These symptoms may be an emergency. Do not wait to see if the symptoms will go away. Get medical help right away. Call your local emergency services (911 in the [place] Do not drive yourself to the hospital. This  information is not intended to replace advice given to you by your health care provider. Make sure you discuss any questions you have with your health care provider. Document Released: 04/26/2008 Document Revised: 04/15/2016 Document Reviewed: 07/23/2015 Elsevier Interactive Patient Education  2018 [employer]  

## 2017-12-08 DIAGNOSIS — R55 Syncope and collapse: Secondary | ICD-10-CM | POA: Diagnosis not present

## 2017-12-08 DIAGNOSIS — E039 Hypothyroidism, unspecified: Secondary | ICD-10-CM | POA: Diagnosis not present

## 2017-12-09 ENCOUNTER — Telehealth: Payer: Self-pay

## 2017-12-09 DIAGNOSIS — R55 Syncope and collapse: Secondary | ICD-10-CM

## 2017-12-09 LAB — CBC WITH DIFFERENTIAL
Basophils Absolute: 0 10*3/uL (ref 0.0–0.2)
Basos: 0 %
EOS (ABSOLUTE): 0.2 10*3/uL (ref 0.0–0.4)
Eos: 2 %
Hematocrit: 44 % (ref 34.0–46.6)
Hemoglobin: 14.3 g/dL (ref 11.1–15.9)
Immature Grans (Abs): 0 10*3/uL (ref 0.0–0.1)
Immature Granulocytes: 0 %
Lymphocytes Absolute: 2.7 10*3/uL (ref 0.7–3.1)
Lymphs: 29 %
MCH: 28 pg (ref 26.6–33.0)
MCHC: 32.5 g/dL (ref 31.5–35.7)
MCV: 86 fL (ref 79–97)
Monocytes Absolute: 0.4 10*3/uL (ref 0.1–0.9)
Monocytes: 5 %
Neutrophils Absolute: 5.9 10*3/uL (ref 1.4–7.0)
Neutrophils: 64 %
RBC: 5.1 x10E6/uL (ref 3.77–5.28)
RDW: 13.4 % (ref 12.3–15.4)
WBC: 9.2 10*3/uL (ref 3.4–10.8)

## 2017-12-09 LAB — COMPREHENSIVE METABOLIC PANEL
ALT: 13 IU/L (ref 0–32)
AST: 14 IU/L (ref 0–40)
Albumin/Globulin Ratio: 1.6 (ref 1.2–2.2)
Albumin: 4.7 g/dL (ref 3.5–5.5)
Alkaline Phosphatase: 79 IU/L (ref 39–117)
BUN/Creatinine Ratio: 9 (ref 9–23)
BUN: 7 mg/dL (ref 6–20)
Bilirubin Total: 0.5 mg/dL (ref 0.0–1.2)
CO2: 21 mmol/L (ref 20–29)
Calcium: 9.7 mg/dL (ref 8.7–10.2)
Chloride: 101 mmol/L (ref 96–106)
Creatinine, Ser: 0.8 mg/dL (ref 0.57–1.00)
GFR calc Af Amer: 119 mL/min/{1.73_m2} (ref >59)
GFR calc non Af Amer: 104 mL/min/{1.73_m2} (ref >59)
Globulin, Total: 3 g/dL (ref 1.5–4.5)
Glucose: 112 mg/dL — ABNORMAL HIGH (ref 65–99)
Potassium: 4.4 mmol/L (ref 3.5–5.2)
Sodium: 140 mmol/L (ref 134–144)
Total Protein: 7.7 g/dL (ref 6.0–8.5)

## 2017-12-09 LAB — TSH: TSH: 2.4 u[IU]/mL (ref 0.450–4.500)

## 2017-12-09 NOTE — Telephone Encounter (Signed)
-----   Message from Trey SailorsAdriana M Pollak, [place]PA-C sent at 12/09/2017  9:44 AM EST ----- TSH is normal. Please try to take thyroid medication consistently. CBC normal with no anemia or infection. CMET normal kidney, liver, and electrolytes. I do continue to recommend referral to neurology if she would like me to place this.

## 2017-12-09 NOTE — Telephone Encounter (Signed)
Pt advised.  She would like to proceed with the neurology referral.   Thanks,   -Makenzye Troutman  

## 2017-12-09 NOTE — Telephone Encounter (Signed)
Referral placed.

## 2017-12-14 ENCOUNTER — Other Ambulatory Visit: Payer: Self-pay | Admitting: Obstetrics and Gynecology

## 2017-12-19 DIAGNOSIS — R55 Syncope and collapse: Secondary | ICD-10-CM | POA: Diagnosis not present

## 2017-12-19 DIAGNOSIS — G44229 Chronic tension-type headache, not intractable: Secondary | ICD-10-CM | POA: Diagnosis not present

## 2017-12-23 DIAGNOSIS — R569 Unspecified convulsions: Secondary | ICD-10-CM | POA: Diagnosis not present

## 2017-12-28 DIAGNOSIS — R55 Syncope and collapse: Secondary | ICD-10-CM | POA: Diagnosis not present

## 2017-12-28 DIAGNOSIS — G44229 Chronic tension-type headache, not intractable: Secondary | ICD-10-CM | POA: Diagnosis not present

## 2018-01-12 ENCOUNTER — Other Ambulatory Visit: Payer: Self-pay | Admitting: Obstetrics and Gynecology

## 2018-01-18 ENCOUNTER — Encounter: Payer: Self-pay | Admitting: Obstetrics and Gynecology

## 2018-01-18 ENCOUNTER — Ambulatory Visit (INDEPENDENT_AMBULATORY_CARE_PROVIDER_SITE_OTHER): Payer: BLUE CROSS/BLUE SHIELD | Admitting: Obstetrics and Gynecology

## 2018-01-18 VITALS — BP 122/88 | HR 115 | Ht 61.0 in | Wt 160.0 lb

## 2018-01-18 DIAGNOSIS — Z113 Encounter for screening for infections with a predominantly sexual mode of transmission: Secondary | ICD-10-CM | POA: Diagnosis not present

## 2018-01-18 DIAGNOSIS — Z3041 Encounter for surveillance of contraceptive pills: Secondary | ICD-10-CM

## 2018-01-18 DIAGNOSIS — Z01419 Encounter for gynecological examination (general) (routine) without abnormal findings: Secondary | ICD-10-CM | POA: Diagnosis not present

## 2018-01-18 DIAGNOSIS — Z124 Encounter for screening for malignant neoplasm of cervix: Secondary | ICD-10-CM | POA: Diagnosis not present

## 2018-01-18 LAB — HM PAP SMEAR: HM Pap smear: NORMAL

## 2018-01-18 MED ORDER — NORGESTIMATE-ETH ESTRADIOL 0.25-35 MG-MCG PO TABS: 1.0000 | ORAL_TABLET | Freq: Every day | ORAL | 3 refills | Status: DC

## 2018-01-18 NOTE — Progress Notes (Signed)
PCP:  Margaretann Loveless, PA-C   Chief Complaint  Patient presents with  . Gynecologic Exam     HPI:      Ms. Angela Nunez is a 27 y.o. No obstetric history on file. who LMP was Patient's last menstrual period was 01/12/2018 (exact date)., presents today for her annual examination.  Her menses are regular every 28-30 days, lasting 4 days.  Dysmenorrhea mild, occurring first 1-2 days of flow. She does not have intermenstrual bleeding.  Sex activity: single partner, contraception - OCP (estrogen/progesterone).  Last Pap: December 22, 2016  Results were: no abnormalities  Hx of STDs: none  There is a FH of breast cancer in her MGGM, genetic testing not indicated. There is no FH of ovarian cancer. The patient does not do self-breast exams.  Tobacco use: The patient denies current or previous tobacco use. Alcohol use: none No drug use.  Exercise: not active  She does not get adequate calcium and Vitamin D in her diet.  Past Medical History:  Diagnosis Date  . Hypothyroidism   . Vaccine for human papilloma virus (HPV) types 6, 11, 16, and 18 administered     Past Surgical History:  Procedure Laterality Date  . WISDOM TOOTH EXTRACTION     x4 extracted     Family History  Problem Relation Age of Onset  . Breast cancer Other     Social History   Socioeconomic History  . Marital status: Single    Spouse name: Not on file  . Number of children: Not on file  . Years of education: Not on file  . Highest education level: Not on file  Social Needs  . Financial resource strain: Not on file  . Food insecurity - worry: Not on file  . Food insecurity - inability: Not on file  . Transportation needs - medical: Not on file  . Transportation needs - non-medical: Not on file  Occupational History  . Not on file  Tobacco Use  . Smoking status: Never Smoker  . Smokeless tobacco: Never Used  Substance and Sexual Activity  . Alcohol use: Yes    Comment: Once or twice a  month  . Drug use: No  . Sexual activity: Yes    Birth control/protection: Pill  Other Topics Concern  . Not on file  Social History Narrative  . Not on file    Current Meds  Medication Sig  . augmented betamethasone dipropionate (DIPROLENE-AF) 0.05 % ointment   . levothyroxine (SYNTHROID, LEVOTHROID) 25 MCG tablet Take 1 tablet (25 mcg total) by mouth daily before breakfast.  . norgestimate-ethinyl estradiol (SPRINTEC 28) 0.25-35 MG-MCG tablet Take 1 tablet by mouth daily.  . [DISCONTINUED] SPRINTEC 28 0.25-35 MG-MCG tablet TAKE 1 TABLET BY MOUTH ONCE DAILY     ROS:  Review of Systems  Constitutional: Negative for fatigue, fever and unexpected weight change.  Respiratory: Negative for cough, shortness of breath and wheezing.   Cardiovascular: Negative for chest pain, palpitations and leg swelling.  Gastrointestinal: Negative for blood in stool, constipation, diarrhea, nausea and vomiting.  Endocrine: Negative for cold intolerance, heat intolerance and polyuria.  Genitourinary: Negative for dyspareunia, dysuria, flank pain, frequency, genital sores, hematuria, menstrual problem, pelvic pain, urgency, vaginal bleeding, vaginal discharge and vaginal pain.  Musculoskeletal: Negative for back pain, joint swelling and myalgias.  Skin: Negative for rash.  Neurological: Negative for dizziness, syncope, light-headedness, numbness and headaches.  Hematological: Negative for adenopathy.  Psychiatric/Behavioral: Negative for agitation, confusion, sleep disturbance  and suicidal ideas. The patient is not nervous/anxious.      Objective: BP 122/88   Pulse (!) 115   Ht 5\' 1"  (1.549 m)   Wt 160 lb (72.6 kg)   LMP 01/12/2018 (Exact Date)   BMI 30.23 kg/m    Physical Exam  Constitutional: She is oriented to person, place, and time. She appears well-developed and well-nourished.  Genitourinary: Vagina normal and uterus normal. There is no rash or tenderness on the right labia. There is  no rash or tenderness on the left labia. No erythema or tenderness in the vagina. No vaginal discharge found. Right adnexum does not display mass and does not display tenderness. Left adnexum does not display mass and does not display tenderness. Cervix does not exhibit motion tenderness or polyp. Uterus is not enlarged or tender.  Neck: Normal range of motion. No thyromegaly present.  Cardiovascular: Normal rate, regular rhythm and normal heart sounds.  No murmur heard. Pulmonary/Chest: Effort normal and breath sounds normal. Right breast exhibits no mass, no nipple discharge, no skin change and no tenderness. Left breast exhibits no mass, no nipple discharge, no skin change and no tenderness.  Abdominal: Soft. There is no tenderness. There is no guarding.  Musculoskeletal: Normal range of motion.  Neurological: She is alert and oriented to person, place, and time. No cranial nerve deficit.  Psychiatric: She has a normal mood and affect. Her behavior is normal.  Vitals reviewed.   Assessment/Plan: Encounter for annual routine gynecological examination  Cervical cancer screening - Plan: IGP,CtNgTv,rfx Aptima HPV ASCU  Screening for STD (sexually transmitted disease) - Plan: IGP,CtNgTv,rfx Aptima HPV ASCU  Encounter for surveillance of contraceptive pills - OCP RF - Plan: norgestimate-ethinyl estradiol (SPRINTEC 28) 0.25-35 MG-MCG tablet  Meds ordered this encounter  Medications  . norgestimate-ethinyl estradiol (SPRINTEC 28) 0.25-35 MG-MCG tablet    Sig: Take 1 tablet by mouth daily.    Dispense:  84 tablet    Refill:  3    Please consider 90 day supplies to promote better adherence    Order Specific Question:   Supervising Provider    Answer:   Nadara MustardHARRIS, ROBERT P [[id number]]             GYN counsel adequate intake of calcium and vitamin D, diet and exercise     F/U  Return in about 1 year (around 01/18/2019).  Sadao Weyer B. Alejandria Wessells, PA-C 01/18/2018 3:44 PM

## 2018-01-18 NOTE — Patient Instructions (Signed)
I value your feedback and entrusting us with your care. If you get a [facility] patient survey, I would appreciate you taking the time to let us know about your experience today. Thank you! 

## 2018-01-20 LAB — IGP,CTNGTV,RFX APTIMA HPV ASCU
CHLAMYDIA, NUC. ACID AMP: NEGATIVE
GONOCOCCUS, NUC. ACID AMP: NEGATIVE
PAP Smear Comment: 0
TRICH VAG BY NAA: NEGATIVE

## 2018-11-12 ENCOUNTER — Encounter: Payer: Self-pay | Admitting: Obstetrics and Gynecology

## 2018-11-13 ENCOUNTER — Other Ambulatory Visit: Payer: Self-pay

## 2018-11-13 DIAGNOSIS — Z3041 Encounter for surveillance of contraceptive pills: Secondary | ICD-10-CM

## 2018-11-13 MED ORDER — NORGESTIMATE-ETH ESTRADIOL 0.25-35 MG-MCG PO TABS: 1.0000 | ORAL_TABLET | Freq: Every day | ORAL | 0 refills | Status: DC

## 2018-11-30 ENCOUNTER — Encounter: Payer: Self-pay | Admitting: Physician Assistant

## 2019-01-09 NOTE — Progress Notes (Signed)
Patient: Angela Nunez, Female    DOB: 1991/06/25, 28 y.o.   MRN: [id number] Visit Date: 01/10/2019  Today's Provider: Mar Daring, PA-C   Chief Complaint  Patient presents with  . Annual Exam   Subjective:     Annual physical exam Angela Nunez is a 28 y.o. female who presents today for health maintenance and complete physical. She feels well. She reports exercising none. She reports she is sleeping fairly well.  Pap: 95/62/13 Normal, Alicia Copland, PA-C (going to switch her woman's care here in the coming years unless she gets pregnant). -----------------------------------------------------------------   Review of Systems  Constitutional: Negative.   HENT: Negative.   Eyes: Negative.   Respiratory: Negative.   Cardiovascular: Negative.   Gastrointestinal: Negative.   Endocrine: Negative.   Genitourinary: Negative.   Musculoskeletal: Negative.   Skin: Negative.   Allergic/Immunologic: Negative.   Neurological: Negative.   Hematological: Negative.   Psychiatric/Behavioral: The patient is nervous/anxious.     Social History      She  reports that she has never smoked. She has never used smokeless tobacco. She reports current alcohol use. She reports that she does not use drugs.       Social History   Socioeconomic History  . Marital status: Single    Spouse name: Not on file  . Number of children: Not on file  . Years of education: Not on file  . Highest education level: Not on file  Occupational History  . Not on file  Social Needs  . Financial resource strain: Not on file  . Food insecurity:    Worry: Not on file    Inability: Not on file  . Transportation needs:    Medical: Not on file    Non-medical: Not on file  Tobacco Use  . Smoking status: Never Smoker  . Smokeless tobacco: Never Used  Substance and Sexual Activity  . Alcohol use: Yes    Comment: Once or twice a month  . Drug use: No  . Sexual activity: Yes    Birth  control/protection: Pill  Lifestyle  . Physical activity:    Days per week: Not on file    Minutes per session: Not on file  . Stress: Not on file  Relationships  . Social connections:    Talks on phone: Not on file    Gets together: Not on file    Attends religious service: Not on file    Active member of club or organization: Not on file    Attends meetings of clubs or organizations: Not on file    Relationship status: Not on file  Other Topics Concern  . Not on file  Social History Narrative  . Not on file    Past Medical History:  Diagnosis Date  . Hypothyroidism   . Vaccine for human papilloma virus (HPV) types 6, 11, 16, and 18 administered      Patient Active Problem List   Diagnosis Date Noted  . Hypothyroidism 02/06/2016    Past Surgical History:  Procedure Laterality Date  . WISDOM TOOTH EXTRACTION     x4 extracted     Family History        Family Status  Relation Name Status  . Mother  Alive  . Father  Alive  . Other MGGM (Not Specified)        Her family history includes Breast cancer in an other family member.  No Known Allergies   Current Outpatient Medications:  .  augmented betamethasone dipropionate (DIPROLENE-AF) 0.05 % ointment, , Disp: , Rfl:  .  levothyroxine (SYNTHROID, LEVOTHROID) 25 MCG tablet, Take 1 tablet (25 mcg total) by mouth daily before breakfast., Disp: 90 tablet, Rfl: 1 .  norgestimate-ethinyl estradiol (SPRINTEC 28) 0.25-35 MG-MCG tablet, Take 1 tablet by mouth daily., Disp: 84 tablet, Rfl: 0   Patient Care Team: Mar Daring, PA-C as PCP - General (Family Medicine)    Objective:    Vitals: BP 127/88 (BP Location: Left Arm, Patient Position: Sitting, Cuff Size: Normal)   Pulse (!) 109   Temp 98.2 F (36.8 C) (Oral)   Wt 171 lb 3.2 oz (77.7 kg)   LMP 12/14/2018   BMI 32.35 kg/m    Vitals:   01/10/19 0830  BP: 127/88  Pulse: (!) 109  Temp: 98.2 F (36.8 C)  TempSrc: Oral  Weight: 171 lb 3.2 oz  (77.7 kg)     Physical Exam Vitals signs reviewed.  Constitutional:      General: She is not in acute distress.    Appearance: Normal appearance. She is well-developed. She is obese. She is not diaphoretic.  HENT:     Head: Normocephalic and atraumatic.     Right Ear: Tympanic membrane, ear canal and external ear normal.     Left Ear: Tympanic membrane, ear canal and external ear normal.     Nose: Nose normal.     Mouth/Throat:     Mouth: Mucous membranes are moist.     Pharynx: Oropharynx is clear. No oropharyngeal exudate.  Eyes:     General: No scleral icterus.       Right eye: No discharge.        Left eye: No discharge.     Extraocular Movements: Extraocular movements intact.     Conjunctiva/sclera: Conjunctivae normal.     Pupils: Pupils are equal, round, and reactive to light.  Neck:     Musculoskeletal: Normal range of motion and neck supple.     Thyroid: No thyromegaly.     Vascular: No JVD.     Trachea: No tracheal deviation.  Cardiovascular:     Rate and Rhythm: Normal rate and regular rhythm.     Pulses: Normal pulses.     Heart sounds: Normal heart sounds. No murmur. No friction rub. No gallop.   Pulmonary:     Effort: Pulmonary effort is normal. No respiratory distress.     Breath sounds: Normal breath sounds. No wheezing or rales.  Chest:     Chest wall: No tenderness.  Abdominal:     General: Bowel sounds are normal. There is no distension.     Palpations: Abdomen is soft. There is no mass.     Tenderness: There is no abdominal tenderness. There is no guarding or rebound.  Musculoskeletal: Normal range of motion.        General: No tenderness.  Lymphadenopathy:     Cervical: No cervical adenopathy.  Skin:    General: Skin is warm and dry.     Findings: No rash.  Neurological:     Mental Status: She is alert and oriented to person, place, and time.  Psychiatric:        Mood and Affect: Mood normal.        Behavior: Behavior normal.        Thought  Content: Thought content normal.        Judgment: Judgment normal.  Depression Screen PHQ 2/9 Scores 01/10/2019 07/01/2017  PHQ - 2 Score 0 0  PHQ- 9 Score 4 0       Assessment & Plan:     Routine Health Maintenance and Physical Exam  Exercise Activities and Dietary recommendations Goals   None     Immunization History  Administered Date(s) Administered  . DTaP 07/06/1993, 09/17/1993, 03/31/1994, 05/09/1995, 07/17/1997  . HPV 9-valent 07/08/2006, 05/03/2007, 10/13/2007  . Hepatitis A 07/08/2006, 05/03/2007  . Hepatitis B [birth year], 07/06/1993, 04/02/1994  . HiB (PRP-OMP) 07/06/1993, 09/17/1993, 03/31/1994  . IPV 07/06/1993, 09/17/1993, 03/31/1994, 05/09/1995, 07/17/1997  . Influenza-Unspecified 09/03/2015, 11/03/2016  . MMR 05/07/1995, 07/17/1997  . Meningococcal Conjugate 06/27/2008  . Meningococcal Mcv4o 08/30/2011  . Td 07/08/2006, 07/01/2017  . Tdap 07/08/2006  . Varicella 08/30/2011    Health Maintenance  Topic Date Due  . HIV Screening  05/14/2008  . INFLUENZA VACCINE  06/22/2018  . PAP-Cervical Cytology Screening  12/23/2019  . PAP SMEAR-Modifier  01/18/2021  . TETANUS/TDAP  07/02/2027     Discussed health benefits of physical activity, and encouraged her to engage in regular exercise appropriate for her age and condition.    1. Annual physical exam Normal physical exam today. Will check labs as below and f/u pending lab results. If labs are stable and WNL she will not need to have these rechecked for one year at her next annual physical exam. She is to call the office in the meantime if she has any acute issue, questions or concerns. - CBC with Differential/Platelet - Comprehensive metabolic panel - Lipid panel - TSH  2. Hypothyroidism due to acquired atrophy of thyroid Not taking her medication. Will check labs as below and f/u pending results. - TSH  3. Screening for HIV without presence of risk factors Will check labs as below and f/u  pending results. - HIV antibody (with reflex)  4. Situational anxiety Worsening. States most often happens at night if her husband works over night or if she gets left alone. Also notices work causes stress. Will start hydroxyzine as below for prn use.  - hydrOXYzine (ATARAX/VISTARIL) 25 MG tablet; Take 1 tablet (25 mg total) by mouth 3 (three) times daily as needed for anxiety.  Dispense: 30 tablet; Refill: 0  5. Class 1 obesity due to excess calories with serious comorbidity and body mass index (BMI) of 32.0 to 32.9 in adult Counseled patient on healthy lifestyle modifications including dieting and exercise.   --------------------------------------------------------------------    Mar Daring, PA-C  [facility]

## 2019-01-10 ENCOUNTER — Ambulatory Visit (INDEPENDENT_AMBULATORY_CARE_PROVIDER_SITE_OTHER): Payer: BLUE CROSS/BLUE SHIELD | Admitting: Physician Assistant

## 2019-01-10 ENCOUNTER — Encounter: Payer: Self-pay | Admitting: Physician Assistant

## 2019-01-10 VITALS — BP 127/88 | HR 109 | Temp 98.2°F | Wt 171.2 lb

## 2019-01-10 DIAGNOSIS — Z0000 Encounter for general adult medical examination without abnormal findings: Secondary | ICD-10-CM

## 2019-01-10 DIAGNOSIS — F419 Anxiety disorder, unspecified: Secondary | ICD-10-CM | POA: Insufficient documentation

## 2019-01-10 DIAGNOSIS — F418 Other specified anxiety disorders: Secondary | ICD-10-CM | POA: Insufficient documentation

## 2019-01-10 DIAGNOSIS — E6609 Other obesity due to excess calories: Secondary | ICD-10-CM

## 2019-01-10 DIAGNOSIS — Z114 Encounter for screening for human immunodeficiency virus [HIV]: Secondary | ICD-10-CM | POA: Diagnosis not present

## 2019-01-10 DIAGNOSIS — E034 Atrophy of thyroid (acquired): Secondary | ICD-10-CM | POA: Diagnosis not present

## 2019-01-10 DIAGNOSIS — Z6832 Body mass index (BMI) 32.0-32.9, adult: Secondary | ICD-10-CM

## 2019-01-10 MED ORDER — HYDROXYZINE HCL 25 MG PO TABS: 25.0000 mg | ORAL_TABLET | Freq: Three times a day (TID) | ORAL | 0 refills | Status: DC | PRN

## 2019-01-10 NOTE — Patient Instructions (Signed)

## 2019-01-12 ENCOUNTER — Telehealth: Payer: Self-pay

## 2019-01-12 LAB — CBC WITH DIFFERENTIAL/PLATELET
Basophils Absolute: 0 10*3/uL (ref 0.0–0.2)
Basos: 0 %
EOS (ABSOLUTE): 0.2 10*3/uL (ref 0.0–0.4)
Eos: 2 %
Hematocrit: 41.5 % (ref 34.0–46.6)
Hemoglobin: 14.2 g/dL (ref 11.1–15.9)
Immature Grans (Abs): 0 10*3/uL (ref 0.0–0.1)
Immature Granulocytes: 0 %
Lymphocytes Absolute: 2.1 10*3/uL (ref 0.7–3.1)
Lymphs: 31 %
MCH: 28.7 pg (ref 26.6–33.0)
MCHC: 34.2 g/dL (ref 31.5–35.7)
MCV: 84 fL (ref 79–97)
MONOS ABS: 0.4 10*3/uL (ref 0.1–0.9)
Monocytes: 5 %
Neutrophils Absolute: 4.2 10*3/uL (ref 1.4–7.0)
Neutrophils: 62 %
Platelets: 243 10*3/uL (ref 150–450)
RBC: 4.95 x10E6/uL (ref 3.77–5.28)
RDW: 12.8 % (ref 11.7–15.4)
WBC: 6.9 10*3/uL (ref 3.4–10.8)

## 2019-01-12 LAB — COMPREHENSIVE METABOLIC PANEL
ALT: 8 IU/L (ref 0–32)
AST: 19 IU/L (ref 0–40)
Albumin/Globulin Ratio: 1.8 (ref 1.2–2.2)
Albumin: 4.5 g/dL (ref 3.9–5.0)
Alkaline Phosphatase: 69 IU/L (ref 39–117)
BUN/Creatinine Ratio: 11 (ref 9–23)
BUN: 8 mg/dL (ref 6–20)
Bilirubin Total: 0.4 mg/dL (ref 0.0–1.2)
CO2: 19 mmol/L — ABNORMAL LOW (ref 20–29)
Calcium: 9.8 mg/dL (ref 8.7–10.2)
Chloride: 102 mmol/L (ref 96–106)
Creatinine, Ser: 0.76 mg/dL (ref 0.57–1.00)
GFR calc Af Amer: 126 mL/min/{1.73_m2} (ref >59)
GFR calc non Af Amer: 109 mL/min/{1.73_m2} (ref >59)
Globulin, Total: 2.5 g/dL (ref 1.5–4.5)
Glucose: 80 mg/dL (ref 65–99)
Potassium: 4.6 mmol/L (ref 3.5–5.2)
SODIUM: 138 mmol/L (ref 134–144)
Total Protein: 7 g/dL (ref 6.0–8.5)

## 2019-01-12 LAB — HIV ANTIBODY (ROUTINE TESTING W REFLEX): HIV Screen 4th Generation wRfx: NONREACTIVE

## 2019-01-12 LAB — TSH: TSH: 2.08 u[IU]/mL (ref 0.450–4.500)

## 2019-01-12 LAB — LIPID PANEL
Chol/HDL Ratio: 3.6 ratio (ref 0.0–4.4)
Cholesterol, Total: 229 mg/dL — ABNORMAL HIGH (ref 100–199)
HDL: 64 mg/dL (ref >39)
LDL CALC: 134 mg/dL — AB (ref 0–99)
Triglycerides: 156 mg/dL — ABNORMAL HIGH (ref 0–149)
VLDL Cholesterol Cal: 31 mg/dL (ref 5–40)

## 2019-01-12 NOTE — Telephone Encounter (Signed)
Patient advised as directed below. 

## 2019-01-12 NOTE — Telephone Encounter (Signed)
Currently her thyroid is normal. She does not require levothyroxine at this time.

## 2019-01-12 NOTE — Telephone Encounter (Signed)
Patient advised.

## 2019-01-12 NOTE — Telephone Encounter (Signed)
Pt called asking about her lab results and was asking about medication for thyroid Levothyroxine if it will be sent in to her pharmacy [facility] [address] so she can pick it up this weekend.  Call back # [phone]

## 2019-01-12 NOTE — Telephone Encounter (Signed)
-----   Message from Margaretann Loveless, [place] sent at 01/12/2019  3:34 PM EST ----- Blood count is normal. Sugar normal. Kidney and liver function are normal. Cholesterol still borderline high but improved from 3 years ago. Thyroid is normal. HIV screen is negative.

## 2019-01-22 ENCOUNTER — Ambulatory Visit: Payer: BLUE CROSS/BLUE SHIELD | Admitting: Obstetrics and Gynecology

## 2019-03-18 ENCOUNTER — Other Ambulatory Visit: Payer: Self-pay | Admitting: Obstetrics and Gynecology

## 2019-03-18 DIAGNOSIS — Z3041 Encounter for surveillance of contraceptive pills: Secondary | ICD-10-CM

## 2019-03-21 ENCOUNTER — Other Ambulatory Visit: Payer: Self-pay

## 2019-03-21 DIAGNOSIS — Z3041 Encounter for surveillance of contraceptive pills: Secondary | ICD-10-CM

## 2019-03-21 MED ORDER — NORGESTIMATE-ETH ESTRADIOL 0.25-35 MG-MCG PO TABS: 1.0000 | ORAL_TABLET | Freq: Every day | ORAL | 3 refills | Status: DC

## 2019-03-21 NOTE — Telephone Encounter (Signed)
Patient states she needs her birth control filled. Alicia Copland filled it on 03/18/2019 but patient states Antony Contras was supposed to receive the rx request from the pharmacy but it went to [facility] instead. The pharmacy did not fill the rx they're holding it for some reason. Please advise refill? [facility] [address]. Patient states she has 2 pills left.

## 2019-04-18 ENCOUNTER — Encounter: Payer: Self-pay | Admitting: Physician Assistant

## 2019-05-31 ENCOUNTER — Encounter: Payer: Self-pay | Admitting: Physician Assistant

## 2019-06-19 ENCOUNTER — Encounter: Payer: Self-pay | Admitting: Physician Assistant

## 2019-08-29 ENCOUNTER — Ambulatory Visit (INDEPENDENT_AMBULATORY_CARE_PROVIDER_SITE_OTHER): Payer: BC Managed Care – PPO | Admitting: Advanced Practice Midwife

## 2019-08-29 ENCOUNTER — Other Ambulatory Visit (HOSPITAL_COMMUNITY)
Admission: RE | Admit: 2019-08-29 | Discharge: 2019-08-29 | Disposition: A | Discharge status: Home / Self Care | Payer: BC Managed Care – PPO | Source: Ambulatory Visit | Attending: Advanced Practice Midwife | Admitting: Advanced Practice Midwife

## 2019-08-29 ENCOUNTER — Other Ambulatory Visit: Payer: Self-pay

## 2019-08-29 ENCOUNTER — Encounter: Payer: Self-pay | Admitting: Advanced Practice Midwife

## 2019-08-29 VITALS — BP 114/78 | Wt 180.0 lb

## 2019-08-29 DIAGNOSIS — Z3400 Encounter for supervision of normal first pregnancy, unspecified trimester: Secondary | ICD-10-CM | POA: Insufficient documentation

## 2019-08-29 DIAGNOSIS — O99211 Obesity complicating pregnancy, first trimester: Secondary | ICD-10-CM

## 2019-08-29 DIAGNOSIS — Z3201 Encounter for pregnancy test, result positive: Secondary | ICD-10-CM

## 2019-08-29 DIAGNOSIS — Z113 Encounter for screening for infections with a predominantly sexual mode of transmission: Secondary | ICD-10-CM | POA: Diagnosis present

## 2019-08-29 DIAGNOSIS — Z23 Encounter for immunization: Secondary | ICD-10-CM | POA: Diagnosis not present

## 2019-08-29 DIAGNOSIS — Z3A01 Less than 8 weeks gestation of pregnancy: Secondary | ICD-10-CM

## 2019-08-29 DIAGNOSIS — O99210 Obesity complicating pregnancy, unspecified trimester: Secondary | ICD-10-CM | POA: Insufficient documentation

## 2019-08-29 DIAGNOSIS — Z6834 Body mass index (BMI) 34.0-34.9, adult: Secondary | ICD-10-CM | POA: Insufficient documentation

## 2019-08-29 LAB — OB RESULTS CONSOLE GC/CHLAMYDIA: Gonorrhea: NEGATIVE

## 2019-08-29 NOTE — Patient Instructions (Signed)
Exercise During Pregnancy Exercise is an important part of being healthy for people of all ages. Exercise improves the function of your heart and lungs and helps you maintain strength, flexibility, and a healthy body weight. Exercise also boosts energy levels and elevates mood. Most women should exercise regularly during pregnancy. In rare cases, women with certain medical conditions or complications may be asked to limit or avoid exercise during pregnancy. How does this affect me? Along with maintaining general strength and flexibility, exercising during pregnancy can help:  Keep strength in muscles that are used during labor and childbirth.  Decrease low back pain.  Reduce symptoms of depression.  Control weight gain during pregnancy.  Reduce the risk of needing insulin if you develop diabetes during pregnancy.  Decrease the risk of cesarean delivery.  Speed up your recovery after giving birth. How does this affect my baby? Exercise can help you have a healthy pregnancy. Exercise does not cause premature birth. It will not cause your baby to weigh less at birth. What exercises can I do? Many exercises are safe for you to do during pregnancy. Do a variety of exercises that safely increase your heart and breathing rates and help you build and maintain muscle strength. Do exercises exactly as told by your health care provider. You may do these exercises:  Walking or hiking.  Swimming.  Water aerobics.  Riding a stationary bike.  Strength training.  Modified yoga or Pilates. Tell your instructor that you are pregnant. Avoid overstretching, and avoid lying on your back for long periods of time.  Running or jogging. Only choose this type of exercise if you: ? Ran or jogged regularly before your pregnancy. ? Can run or jog and still talk in complete sentences. What exercises should I avoid? Depending on your level of fitness and whether you exercised regularly before your  pregnancy, you may be told to limit high-intensity exercise. You can tell that you are exercising at a high intensity if you are breathing much harder and faster and cannot hold a conversation while exercising. You must avoid:  Contact sports.  Activities that put you at risk for falling on or being hit in the belly, such as downhill skiing, water skiing, surfing, rock climbing, cycling, gymnastics, and horseback riding.  Scuba diving.  Skydiving.  Yoga or Pilates in a room that is heated to high temperatures.  Jogging or running, unless you ran or jogged regularly before your pregnancy. While jogging or running, you should always be able to talk in full sentences. Do not run or jog so fast that you are unable to have a conversation.  Do not exercise at more than 6,000 feet above sea level (high elevation) if you are not used to exercising at high elevation. How do I exercise in a safe way?   Avoid overheating. Do not exercise in very high temperatures.  Wear loose-fitting, breathable clothes.  Avoid dehydration. Drink enough water before, during, and after exercise to keep your urine pale yellow.  Avoid overstretching. Because of hormone changes during pregnancy, it is easy to overstretch muscles, tendons, and ligaments during pregnancy.  Start slowly and ask your health care provider to recommend the types of exercise that are safe for you.  Do not exercise to lose weight. Follow these instructions at home:  Exercise on most days or all days of the week. Try to exercise for 30 minutes a day, 5 days a week, unless your health care provider tells you not to.  If  you actively exercised before your pregnancy and you are healthy, your health care provider may tell you to continue to do moderate to high-intensity exercise.  If you are just starting to exercise or did not exercise much before your pregnancy, your health care provider may tell you to do low to moderate-intensity  exercise. Questions to ask your health care provider  Is exercise safe for me?  What are signs that I should stop exercising?  Does my health condition mean that I should not exercise during pregnancy?  When should I avoid exercising during pregnancy? Stop exercising and contact a health care provider if: You have any unusual symptoms, such as:  Mild contractions of the uterus or cramps in the abdomen.  Dizziness that does not go away when you rest. Stop exercising and get help right away if: You have any unusual symptoms, such as:  Sudden, severe pain in your low back or your belly.  Mild contractions of the uterus or cramps in the abdomen that do not improve with rest and drinking fluids.  Chest pain.  Bleeding or fluid leaking from your vagina.  Shortness of breath. These symptoms may represent a serious problem that is an emergency. Do not wait to see if the symptoms will go away. Get medical help right away. Call your local emergency services (911 in the [place] Do not drive yourself to the hospital. Summary  Most women should exercise regularly throughout pregnancy. In rare cases, women with certain medical conditions or complications may be asked to limit or avoid exercise during pregnancy.  Do not exercise to lose weight during pregnancy.  Your health care provider will tell you what level of physical activity is right for you.  Stop exercising and contact a health care provider if you have mild contractions of the uterus or cramps in the abdomen. Get help right away if these contractions or cramps do not improve with rest and drinking fluids.  Stop exercising and get help right away if you have sudden, severe pain in your low back or belly, chest pain, shortness of breath, or bleeding or leaking of fluid from your vagina. This information is not intended to replace advice given to you by your health care provider. Make sure you discuss any questions you have with your  health care provider. Document Released: 11/08/2005 Document Revised: 03/01/2019 Document Reviewed: 12/13/2018 Elsevier Patient Education  2020 [employer] Eating Plan for Pregnant Women While you are pregnant, your body requires additional nutrition to help support your growing baby. You also have a higher need for some vitamins and minerals, such as folic acid, calcium, iron, and vitamin D. Eating a healthy, well-balanced diet is very important for your health and your baby's health. Your need for extra calories varies for the three 3-month segments of your pregnancy (trimesters). For most women, it is recommended to consume:  150 extra calories a day during the first trimester.  300 extra calories a day during the second trimester.  300 extra calories a day during the third trimester. What are tips for following this plan?   Do not try to lose weight or go on a diet during pregnancy.  Limit your overall intake of foods that have "empty calories." These are foods that have little nutritional value, such as sweets, desserts, candies, and sugar-sweetened beverages.  Eat a variety of foods (especially fruits and vegetables) to get a full range of vitamins and minerals.  Take a prenatal vitamin to help meet your additional vitamin   and mineral needs during pregnancy, specifically for folic acid, iron, calcium, and vitamin D.  Remember to stay active. Ask your health care provider what types of exercise and activities are safe for you.  Practice good food safety and cleanliness. Wash your hands before you eat and after you prepare raw meat. Wash all fruits and vegetables well before peeling or eating. Taking these actions can help to prevent food-borne illnesses that can be very dangerous to your baby, such as listeriosis. Ask your health care provider for more information about listeriosis. What does 150 extra calories look like? Healthy options that provide 150 extra calories each day  could be any of the following:  6-8 oz (170-230 g) of plain low-fat yogurt with  cup of berries.  1 apple with 2 teaspoons (11 g) of peanut butter.  Cut-up vegetables with  cup (60 g) of hummus.  8 oz (230 mL) or 1 cup of low-fat chocolate milk.  1 stick of string cheese with 1 medium orange.  1 peanut butter and jelly sandwich that is made with one slice of whole-wheat bread and 1 tsp (5 g) of peanut butter. For 300 extra calories, you could eat two of those healthy options each day. What is a healthy amount of weight to gain? The right amount of weight gain for you is based on your BMI before you became pregnant. If your BMI:  Was less than 18 (underweight), you should gain 28-40 lb (13-18 kg).  Was 18-24.9 (normal), you should gain 25-35 lb (11-16 kg).  Was 25-29.9 (overweight), you should gain 15-25 lb (7-11 kg).  Was 30 or greater (obese), you should gain 11-20 lb (5-9 kg). What if I am having twins or multiples? Generally, if you are carrying twins or multiples:  You may need to eat 300-600 extra calories a day.  The recommended range for total weight gain is 25-54 lb (11-25 kg), depending on your BMI before pregnancy.  Talk with your health care provider to find out about nutritional needs, weight gain, and exercise that is right for you. What foods can I eat?  Grains All grains. Choose whole grains, such as whole-wheat bread, oatmeal, or brown rice. Vegetables All vegetables. Eat a variety of colors and types of vegetables. Remember to wash your vegetables well before peeling or eating. Fruits All fruits. Eat a variety of colors and types of fruit. Remember to wash your fruits well before peeling or eating. Meats and other protein foods Lean meats, including chicken, turkey, fish, and lean cuts of beef, veal, or pork. If you eat fish or seafood, choose options that are higher in omega-3 fatty acids and lower in mercury, such as salmon, herring, mussels, trout,  sardines, pollock, shrimp, crab, and lobster. Tofu. Tempeh. Beans. Eggs. Peanut butter and other nut butters. Make sure that all meats, poultry, and eggs are cooked to food-safe temperatures or "well-done." Two or more servings of fish are recommended each week in order to get the most benefits from omega-3 fatty acids that are found in seafood. Choose fish that are lower in mercury. You can find more information online:  www.fda.gov Dairy Pasteurized milk and milk alternatives (such as almond milk). Pasteurized yogurt and pasteurized cheese. Cottage cheese. Sour cream. Beverages Water. Juices that contain 100% fruit juice or vegetable juice. Caffeine-free teas and decaffeinated coffee. Drinks that contain caffeine are okay to drink, but it is better to avoid caffeine. Keep your total caffeine intake to less than 200 mg each day (which   is 12 oz or 355 mL of coffee, tea, or soda) or the limit as told by your health care provider. Fats and oils Fats and oils are okay to include in moderation. Sweets and desserts Sweets and desserts are okay to include in moderation. Seasoning and other foods All pasteurized condiments. The items listed above may not be a complete list of recommended foods and beverages. Contact your dietitian for more options. The items listed above may not be a complete list of foods and beverages [you/your child] can eat. Contact a dietitian for more information. What foods are not recommended? Vegetables Raw (unpasteurized) vegetable juices. Fruits Unpasteurized fruit juices. Meats and other protein foods Lunch meats, bologna, hot dogs, or other deli meats. (If you must eat those meats, reheat them until they are steaming hot.) Refrigerated pat, meat spreads from a meat counter, smoked seafood that is found in the refrigerated section of a store. Raw or undercooked meats, poultry, and eggs. Raw fish, such as sushi or sashimi. Fish that have high mercury content, such as  tilefish, shark, swordfish, and king mackerel. To learn more about mercury in fish, talk with your health care provider or look for online resources, such as:  [employer]Gaming.ch Dairy Raw (unpasteurized) milk and any foods that have raw milk in them. Soft cheeses, such as feta, queso blanco, queso fresco, Brie, Camembert cheeses, blue-veined cheeses, and Panela cheese (unless it is made with pasteurized milk, which must be stated on the label). Beverages Alcohol. Sugar-sweetened beverages, such as sodas, teas, or energy drinks. Seasoning and other foods Homemade fermented foods and drinks, such as pickles, sauerkraut, or kombucha drinks. (Store-bought pasteurized versions of these are okay.) Salads that are made in a store or deli, such as ham salad, chicken salad, egg salad, tuna salad, and seafood salad. The items listed above may not be a complete list of foods and beverages to avoid. Contact your dietitian for more information. The items listed above may not be a complete list of foods and beverages [you/your child] should avoid. Contact a dietitian for more information. Where to find more information To calculate the number of calories you need based on your height, weight, and activity level, you can use an online calculator such as:  MobileTransition.ch To calculate how much weight you should gain during pregnancy, you can use an online pregnancy weight gain calculator such as:  StreamingFood.com.cy Summary  While you are pregnant, your body requires additional nutrition to help support your growing baby.  Eat a variety of foods, especially fruits and vegetables to get a full range of vitamins and minerals.  Practice good food safety and cleanliness. Wash your hands before you eat and after you prepare raw meat. Wash all fruits and vegetables well before peeling or eating. Taking these actions can help to prevent food-borne illnesses, such as  listeriosis, that can be very dangerous to your baby.  Do not eat raw meat or fish. Do not eat fish that have high mercury content, such as tilefish, shark, swordfish, and king mackerel. Do not eat unpasteurized (raw) dairy.  Take a prenatal vitamin to help meet your additional vitamin and mineral needs during pregnancy, specifically for folic acid, iron, calcium, and vitamin D. This information is not intended to replace advice given to you by your health care provider. Make sure you discuss any questions you have with your health care provider. Document Released: 08/23/2014 Document Revised: 03/01/2019 Document Reviewed: 08/05/2017 Elsevier Patient Education  2020 [employer] During Pregnancy  Genetic testing during pregnancy is also called prenatal genetic testing. This type of testing can determine if your baby is at risk of being born with a disorder caused by abnormal genes or chromosomes (genetic disorder). Chromosomes contain genes that control how your baby will develop in your womb. There are many different genetic disorders. Examples of genetic disorders that may be found through genetic testing include Down syndrome and cystic fibrosis. Gene changes (mutations) can be passed down through families. Genetic testing is offered to all women before or during pregnancy. You can choose whether to have genetic testing. Why is genetic testing done? Genetic testing is done during pregnancy to find out whether your child is at risk for a genetic disorder. Having genetic testing allows you to:  Discuss your test results and options with a genetic counselor.  Prepare for a baby that may be born with a genetic disorder. Learning about the disorder ahead of time helps you be better prepared to manage it. Your health care providers can also be prepared in case your baby requires special care before or after birth.  Consider whether you want to continue with the pregnancy. In some  cases, genetic testing may be done to learn about the traits a child will inherit. Types of genetic tests There are two basic types of genetic testing. Screening tests indicate whether your developing baby (fetus) is at higher risk for a genetic disorder. Diagnostic tests check actual fetal cells to diagnose a genetic disorder. Screening tests     Screening tests will not harm your baby. They are recommended for all pregnant women. Types of screening tests include:  Carrier screening. This test involves checking genes from both parents by testing their blood or saliva. The test checks to find out if the parents carry a genetic mutation that may be passed to a baby. In most cases, both parents must carry the mutation for a baby to be at risk.  First trimester screening. This test combines a blood test with sound wave imaging of your baby (fetal ultrasound). This screening test checks for a risk of Down syndrome or other defects caused by having extra chromosomes. It also checks for defects of the heart, abdomen, or skeleton.  Second trimester screening also combines a blood test with a fetal ultrasound exam. It checks for a risk of genetic defects of the face, brain, spine, heart, or limbs.  Combined or sequential screening. This type of testing combines the results of first and second trimester screening. This type of testing may be more accurate than first or second trimester screening alone.  Cell-free DNA testing. This is a blood test that detects cells released by the placenta that get into the mother's blood. It can be used to check for a risk of Down syndrome, other extra chromosome syndromes, and disorders caused by abnormal numbers of sex chromosomes. This test can be done any time after 10 weeks of pregnancy.  Diagnostic tests Diagnostic tests carry slight risks of problems, including bleeding, infection, and loss of the pregnancy. These tests are done only if your baby is at risk for a  genetic disorder. You may meet with a genetic counselor to discuss the risks and benefits before having diagnostic tests. Examples of diagnostic tests include:  Chorionic villus sampling (CVS). This involves a procedure to remove and test a sample of cells taken from the placenta. The procedure may be done between 10 and 12 weeks of pregnancy.  Amniocentesis. This involves a procedure to remove  and test a sample of fluid (amniotic fluid) and cells from the sac that surrounds the developing baby. The procedure may be done between 15 and 20 weeks of pregnancy. What do the results mean? For a screening test:  If the results are negative, it often means that your child is not at higher risk. There is still a slight chance your child could have a genetic disorder.  If the results are positive, it does not mean your child will have a genetic disorder. It may mean that your child has a higher-than-normal risk for a genetic disorder. In that case, you may want to talk with a genetic counselor about whether you should have diagnostic genetic tests. For a diagnostic test:  If the result is negative, it is unlikely that your child will have a genetic disorder.  If the test is positive for a genetic disorder, it is likely that your child will have the disorder. The test may not tell how severe the disorder will be. Talk with your health care provider about your options. Questions to ask your health care provider Before talking to your health care provider about genetic testing, find out if there is a history of genetic disorders in your family. It may also help to know your family's ethnic origins. Then ask your health care provider the following questions:  Is my baby at risk for a genetic disorder?  What are the benefits of having genetic screening?  What tests are best for me and my baby?  What are the risks of each test?  If I get a positive result on a screening test, what is the next step?   Should I meet with a genetic counselor before having a diagnostic test?  Should my partner or other members of my family be tested?  How much do the tests cost? Will my insurance cover the testing? Summary  Genetic testing is done during pregnancy to find out whether your child is at risk for a genetic disorder.  Genetic testing is offered to all women before or during pregnancy. You can choose whether to have genetic testing.  There are two basic types of genetic testing. Screening tests indicate whether your developing baby (fetus) is at higher risk for a genetic disorder. Diagnostic tests check actual fetal cells to diagnose a genetic disorder.  If a diagnostic genetic test is positive, talk with your health care provider about your options. This information is not intended to replace advice given to you by your health care provider. Make sure you discuss any questions you have with your health care provider. Document Released: 01/23/2018 Document Revised: 03/01/2019 Document Reviewed: 01/23/2018 Elsevier Patient Education  2020 [employer]. Prenatal Care Prenatal care is health care during pregnancy. It helps you and your unborn baby (fetus) stay as healthy as possible. Prenatal care may be provided by a midwife, a family practice health care provider, or a childbirth and pregnancy specialist (obstetrician). How does this affect me? During pregnancy, you will be closely monitored for any new conditions that might develop. To lower your risk of pregnancy complications, you and your health care provider will talk about any underlying conditions you have. How does this affect my baby? Early and consistent prenatal care increases the chance that your baby will be healthy during pregnancy. Prenatal care lowers the risk that your baby will be:  Born early (prematurely).  Smaller than expected at birth (small for gestational age). What can I expect at the first prenatal care visit?  Your  first prenatal care visit will likely be the longest. You should schedule your first prenatal care visit as soon as you know that you are pregnant. Your first visit is a good time to talk about any questions or concerns you have about pregnancy. At your visit, you and your health care provider will talk about:  Your medical history, including: ? Any past pregnancies. ? Your family's medical history. ? The baby's father's medical history. ? Any long-term (chronic) health conditions you have and how you manage them. ? Any surgeries or procedures you have had. ? Any current over-the-counter or prescription medicines, herbs, or supplements you are taking.  Other factors that could pose a risk to your baby, including:  Your home setting and your stress levels, including: ? Exposure to abuse or violence. ? Household financial strain. ? Mental health conditions you have.  Your daily health habits, including diet and exercise. Your health care provider will also:  Measure your weight, height, and blood pressure.  Do a physical exam, including a pelvic and breast exam.  Perform blood tests and urine tests to check for: ? Urinary tract infection. ? Sexually transmitted infections (STIs). ? Low iron levels in your blood (anemia). ? Blood type and certain proteins on red blood cells (Rh antibodies). ? Infections and immunity to viruses, such as hepatitis B and rubella. ? HIV (human immunodeficiency virus).  Do an ultrasound to confirm your baby's growth and development and to help predict your estimated due date (EDD). This ultrasound is done with a probe that is inserted into the vagina (transvaginal ultrasound).  Discuss your options for genetic screening.  Give you information about how to keep yourself and your baby healthy, including: ? Nutrition and taking vitamins. ? Physical activity. ? How to manage pregnancy symptoms such as nausea and vomiting (morning sickness). ? Infections  and substances that may be harmful to your baby and how to avoid them. ? Food safety. ? Dental care. ? Working. ? Travel. ? Warning signs to watch for and when to call your health care provider. How often will I have prenatal care visits? After your first prenatal care visit, you will have regular visits throughout your pregnancy. The visit schedule is often as follows:  Up to week 28 of pregnancy: once every 4 weeks.  28-36 weeks: once every 2 weeks.  After 36 weeks: every week until delivery. Some women may have visits more or less often depending on any underlying health conditions and the health of the baby. Keep all follow-up and prenatal care visits as told by your health care provider. This is important. What happens during routine prenatal care visits? Your health care provider will:  Measure your weight and blood pressure.  Check for fetal heart sounds.  Measure the height of your uterus in your abdomen (fundal height). This may be measured starting around week 20 of pregnancy.  Check the position of your baby inside your uterus.  Ask questions about your diet, sleeping patterns, and whether you can feel the baby move.  Review warning signs to watch for and signs of labor.  Ask about any pregnancy symptoms you are having and how you are dealing with them. Symptoms may include: ? Headaches. ? Nausea and vomiting. ? Vaginal discharge. ? Swelling. ? Fatigue. ? Constipation. ? Any discomfort, including back or pelvic pain. Make a list of questions to ask your health care provider at your routine visits. What tests might I have during prenatal care visits?  You may have blood, urine, and imaging tests throughout your pregnancy, such as:  Urine tests to check for glucose, protein, or signs of infection.  Glucose tests to check for a form of diabetes that can develop during pregnancy (gestational diabetes mellitus). This is usually done around week 24 of pregnancy.  An  ultrasound to check your baby's growth and development and to check for birth defects. This is usually done around week 20 of pregnancy.  A test to check for group B strep (GBS) infection. This is usually done around week 36 of pregnancy.  Genetic testing. This may include blood or imaging tests, such as an ultrasound. Some genetic tests are done during the first trimester and some are done during the second trimester. What else can I expect during prenatal care visits? Your health care provider may recommend getting certain vaccines during pregnancy. These may include:  A yearly flu shot (annual influenza vaccine). This is especially important if you will be pregnant during flu season.  Tdap (tetanus, diphtheria, pertussis) vaccine. Getting this vaccine during pregnancy can protect your baby from whooping cough (pertussis) after birth. This vaccine may be recommended between weeks 27 and 36 of pregnancy. Later in your pregnancy, your health care provider may give you information about:  Childbirth and breastfeeding classes.  Choosing a health care provider for your baby.  Umbilical cord banking.  Breastfeeding.  Birth control after your baby is born.  The hospital labor and delivery unit and how to tour it.  Registering at the hospital before you go into labor. Where to find more information  Office on Women's Health: TravelLesson.ca  American Pregnancy Association: americanpregnancy.org  March of Dimes: marchofdimes.org Summary  Prenatal care helps you and your baby stay as healthy as possible during pregnancy.  Your first prenatal care visit will most likely be the longest.  You will have visits and tests throughout your pregnancy to monitor your health and your baby's health.  Bring a list of questions to your visits to ask your health care provider.  Make sure to keep all follow-up and prenatal care visits with your health care provider. This information is not  intended to replace advice given to you by your health care provider. Make sure you discuss any questions you have with your health care provider. Document Released: 11/11/2003 Document Revised: 02/28/2019 Document Reviewed: 11/07/2017 Elsevier Patient Education  2020 [employer].    COVID-19 and Your Pregnancy FAQ  How can I prevent infection with COVID-19 during my pregnancy? Social distancing is key. Please limit any interactions in public. Try and work from home if possible. Frequently wash your hands after touching possibly contaminated surfaces. Avoid touching your face.  Minimize trips to the store. Consider online ordering when possible.   Should I wear a mask? YES. It is recommended by the CDC that all people wear a cloth mask or facial covering in public. You should wear a mask to your visits in the office. This will help reduce transmission as well as your risk or acquiring COVID-19. New studies are showing that even asymptomatic individuals can spread the virus from talking.   Where can I get a mask? [place] and the city of [place] are partnering to provide masks to community members. You can pick up a mask from several locations. This website also has instructions about how to make a mask by sewing or without sewing by using a t-shirt or bandana.  https://www.[place].gov/i-want-to/learn-about/covid-19-information-and-updates/covid-19-face-mask-project  Studies have shown that if you were  a tube or nylon stocking from pantyhose over a cloth mask it makes the cloth mask almost as effective as a N95 mask.  [employer].de  What are the symptoms of COVID-19? Fever (greater than 100.4 F), dry cough, shortness of breath.  Am I more at risk for COVID-19 since I am pregnant? There is not currently data showing that pregnant women are more  adversely impacted by COVID-19 than the general population. However, we know that pregnant women tend to have worse respiratory complications from similar diseases such as the flu and SARS and for this reason should be considered an at-risk population.  What do I do if I am experiencing the symptoms of COVID-19? Testing is being limited because of test availability. If you are experiencing symptoms you should quarantine yourself, and the members of your family, for at least 2 weeks at home.   Please visit this website for more information: DiscoHelp.si.html  When should I go to the Emergency Room? Please go to the emergency room if you are experiencing ANY of these symptoms*:  1.    Difficulty breathing or shortness of breath 2.    Persistent pain or pressure in the chest 3.    Confusion or difficulty being aroused (or awakened) 4.    Bluish lips or face  *This list is not all inclusive. Please consult our office for any other symptoms that are severe or concerning.  What do I do if I am having difficulty breathing? You should go to the Emergency Room for evaluation. At this time they have a tent set up for evaluating patients with COVID-19 symptoms.   How will my prenatal care be different because of the COVID-19 pandemic? It has been recommended to reduce the frequency of face-to-face visits and use resources such as telephone and virtual visits when possible. Using a scale, blood pressure machine and fetal doppler at home can further help reduce face-to-face visits. You will be provided with additional information on this topic.  We ask that you come to your visits alone to minimize potential exposures to  COVID-19.  How can I receive childbirth education? At this time in-person classes have been cancelled. You can register for online childbirth education, breastfeeding, and newborn care classes.  Please visit:   [email or web address] for more information  How will my hospital birth experience be different? The hospital is currently limiting visitors. This means that while you are in labor you can only have one person at the hospital with you. Additional family members will not be allowed to wait in the building or outside your room. Your one support person can be the father of the baby, a relative, a doula, or a friend. Once one support person is designated that person will wear a band. This band cannot be shared with multiple people.  Nitrous Gas is not being offered for pain relief since the tubing and filter for the machine can not be sanitized in a way to guarantee prevention of transmission of COVID-19.  Nasal cannula use of oxygen for fetal indications has also been discontinued.  Currently a clear plastic sheet is being hung between mom and the delivering provider during pushing and delivery to help prevent transmission of COVID-19.      How long will I stay in the hospital for after giving birth? It is also recommended that discharge home be expedited during the COVID-19 outbreak. This means staying for 1 day after a vaginal delivery and 2 days after a cesarean section. Patients who  need to stay longer for medical reasons are allowed to do so, but the goal will be for expedited discharge home.   What if I have COVID-19 and I am in labor? We ask that you wear a mask while on labor and delivery. We will try and accommodate you being placed in a room that is capable of filtering the air. Please call ahead if you are in labor and on your way to the hospital. The phone number for [facility] and delivery at [facility] is [phone].  If I have COVID-19 when my baby is born how can I prevent my baby from contracting COVID-19? This is an issue that will have to be discussed on a case-by-case basis. Current recommendations suggest providing separate isolation rooms for both the  mother and new infant as well as limiting visitors. However, there are practical challenges to this recommendation. The situation will assuredly change and decisions will be influenced by the desires of the mother and availability of space.  Some suggestions are the use of a curtain or physical barrier between mom and infant, hand hygiene, mom wearing a mask, or 6 feet of spacing between a mom and infant.   Can I breastfeed during the COVID-19 pandemic?   Yes, breastfeeding is encouraged.  Can I breastfeed if I have COVID-19? Yes. Covid-19 has not been found in breast milk. This means you cannot give COVID-19 to your child through breast milk. Breast feeding will also help pass antibodies to fight infection to your baby.   What precautions should I take when breastfeeding if I have COVID-19? If a mother and newborn do room-in and the mother wishes to feed at the breast, she should put on a facemask and practice hand hygiene before each feeding.  What precautions should I take when pumping if I have COVID-19? Prior to expressing breast milk, mothers should practice hand hygiene. After each pumping session, all parts that come into contact with breast milk should be thoroughly washed and the entire pump should be appropriately disinfected per the manufacturer's instructions. This expressed breast milk should be fed to the newborn by a healthy caregiver.  What if I am pregnant and work in healthcare? Based on limited data regarding COVID-19 and pregnancy, ACOG currently does not propose creating additional restrictions on pregnant health care personnel because of COVID-19 alone. Pregnant women do not appear to be at higher risk of severe disease related to COVID-19. Pregnant health care personnel should follow CDC risk assessment and infection control guidelines for health care personnel exposed to patients with suspected or confirmed COVID-19. Adherence to recommended infection prevention and control  practices is an important part of protecting all health care personnel in health care settings.    Information on COVID-19 in pregnancy is very limited; however, facilities may want to consider limiting exposure of pregnant health care personnel to patients with confirmed or suspected COVID-19 infection, especially during higher-risk procedures (eg, aerosol-generating procedures), if feasible, based on staffing availability.

## 2019-08-29 NOTE — Progress Notes (Signed)
New Obstetric Patient H&P    Chief Complaint: "Desires prenatal care"   History of Present Illness: Patient is a 28 y.o. G1P0000 Not Hispanic or Latino female, presents with amenorrhea and positive home pregnancy test. Patient's last menstrual period was 07/19/2019 (exact date). and based on her  LMP, her EDD is Estimated Date of Delivery: 04/24/20 and her EGA is [id number]. Cycles are 4. days, regular, and occur approximately every : 28 days. Her last pap smear was 1 years ago and was no abnormalities.    She had a urine pregnancy test which was positive 2 week(s)  ago. Her last menstrual period was normal and lasted for  4 day(s). Since her LMP she claims she has experienced breast tenderness, nausea. She denies vaginal bleeding. Her past medical history is noncontributory. This is her first pregnancy.  Since her LMP, she admits to the use of tobacco products  no She claims she has gained   no pounds since the start of her pregnancy.  There are cats in the home in the home  no  She admits close contact with children on a regular basis  no  She has had chicken pox in the past no She has had Tuberculosis exposures, symptoms, or previously tested positive for TB   no Current or past history of domestic violence. no  Genetic Screening/Teratology Counseling: (Includes patient, baby's father, or anyone in either family with:)   1. [facility]'s age [facility]/= 2535 [facility]  no 2. Thalassemia ([place], [place], Mediterranean, or Asian background): MCV<80  no 3. Neural tube defect (meningomyelocele, spina bifida, anencephaly)  no 4. Congenital heart defect  no  5. Down syndrome  no 6. Tay-Sachs (Jewish, [place])  no 7. Canavan's Disease  no 8. Sickle cell disease or trait (African)  no  9. Hemophilia or other blood disorders  no  10. Muscular dystrophy  no  11. Cystic fibrosis  no  12. Huntington's Chorea  no  13. Mental retardation/autism  no 14. Other inherited genetic or chromosomal disorder  no  15. Maternal metabolic disorder (DM, PKU, etc)  no 16. Patient or FOB with a child with a birth defect not listed above no  16a. Patient or FOB with a birth defect themselves no 17. Recurrent pregnancy loss, or stillbirth  no  18. Any medications since LMP other than prenatal vitamins (include vitamins, supplements, OTC meds, drugs, alcohol)  no 19. Any other genetic/environmental exposure to discuss: patient's mother with bicornuate uterus  Infection History:   1. Lives with someone with TB or TB exposed  no  2. Patient or partner has history of genital herpes  no 3. Rash or viral illness since LMP  no 4. History of STI (GC, CT, HPV, syphilis, HIV)  no 5. History of recent travel :  no  Other pertinent information:  no     Review of Systems:10 point review of systems negative unless otherwise noted in HPI  Past Medical History:  Past Medical History:  Diagnosis Date  . Hypothyroidism   . Vaccine for human papilloma virus (HPV) types 6, 11, 16, and 18 administered     Past Surgical History:  Past Surgical History:  Procedure Laterality Date  . WISDOM TOOTH EXTRACTION     x4 extracted     Gynecologic History: Patient's last menstrual period was 07/19/2019 (exact date).  Obstetric History: G1P0000  Family History:  Family History  Problem Relation Age of Onset  . Breast cancer Other  Social History:  Social History   Socioeconomic History  . Marital status: Single    Spouse name: Not on file  . Number of children: Not on file  . Years of education: Not on file  . Highest education level: Not on file  Occupational History  . Not on file  Social Needs  . Financial resource strain: Not on file  . Food insecurity    Worry: Not on file    Inability: Not on file  . Transportation needs    Medical: Not on file    Non-medical: Not on file  Tobacco Use  . Smoking status: Never Smoker  . Smokeless tobacco: Never Used  Substance and Sexual Activity  .  Alcohol use: Not Currently    Comment: Once or twice a month  . Drug use: No  . Sexual activity: Yes    Birth control/protection: None  Lifestyle  . Physical activity    Days per week: Not on file    Minutes per session: Not on file  . Stress: Not on file  Relationships  . Social Herbalist on phone: Not on file    Gets together: Not on file    Attends religious service: Not on file    Active member of club or organization: Not on file    Attends meetings of clubs or organizations: Not on file    Relationship status: Not on file  . Intimate partner violence    Fear of current or ex partner: Not on file    Emotionally abused: Not on file    Physically abused: Not on file    Forced sexual activity: Not on file  Other Topics Concern  . Not on file  Social History Narrative  . Not on file    Allergies:  No Known Allergies  Medications: Prior to Admission medications   Medication Sig Start Date End Date Taking? Authorizing Provider  augmented betamethasone dipropionate (DIPROLENE-AF) 0.05 % ointment  02/12/17  Yes [provider]  Prenatal Vit-Fe Fumarate-FA (PRENATAL VITAMIN PO) Take by mouth.   Yes [provider]    Physical Exam Vitals: Blood pressure 114/78, weight 180 lb (81.6 kg), last menstrual period 07/19/2019.  General: NAD HEENT: normocephalic, anicteric Thyroid: no enlargement, no palpable nodules Pulmonary: No increased work of breathing, CTAB Cardiovascular: RRR, distal pulses 2+ Abdomen: NABS, soft, non-tender, non-distended.  Umbilicus without lesions.  No hepatomegaly, splenomegaly or masses palpable. No evidence of hernia  Genitourinary:  External: Normal external female genitalia.  Normal urethral meatus, normal Bartholin's and Skene's glands.    Vagina: Normal vaginal mucosa, no evidence of prolapse.    Cervix: Grossly normal in appearance, no bleeding, no CMT  Uterus:  Non-enlarged, mobile, normal contour.    Adnexa:  ovaries non-enlarged, no adnexal masses  Rectal: deferred Extremities: no edema, erythema, or tenderness Neurologic: Grossly intact Psychiatric: mood appropriate, affect full   Assessment: 28 y.o. G1P0000 at [redacted]w[redacted]d by LMP presenting to initiate prenatal care  Plan: 1) Avoid alcoholic beverages. 2) Patient encouraged not to smoke.  3) Discontinue the use of all non-medicinal drugs and chemicals.  4) Take prenatal vitamins daily.  5) Nutrition, food safety (fish, cheese advisories, and high nitrite foods) and exercise discussed. 6) Hospital and practice style discussed with cross coverage system.  7) Genetic Screening, such as with 1st Trimester Screening, cell free fetal DNA, AFP testing, and Ultrasound, as well as with amniocentesis and CVS as appropriate, is discussed with patient. At  the conclusion of today's visit patient leaning towards having genetic testing 8) Patient is asked about travel to areas at risk for the Zika virus, and counseled to avoid travel and exposure to mosquitoes or sexual partners who may have themselves been exposed to the virus. Testing is discussed, and will be ordered as appropriate.  9) Aptima, urine culture and Flu vaccine today 10) Return in 9 days for dating scan, early glucola, NOB labs 11) Genetic screen when appropriate- will decide if she wants to pay out of pocket for NIPT (cell free DNA)   Tresea Mall, CNM [facility] 08/29/2019, 10:43 AM

## 2019-08-29 NOTE — Progress Notes (Signed)
NOB today. LMP: 07/19/2019

## 2019-08-31 LAB — URINE CULTURE

## 2019-09-03 LAB — POCT URINE PREGNANCY: Preg Test, Ur: POSITIVE — AB

## 2019-09-03 NOTE — Addendum Note (Signed)
Addended by: Brien Few on: 09/03/2019 08:58 AM   Modules accepted: Orders

## 2019-09-06 LAB — CERVICOVAGINAL ANCILLARY ONLY
Chlamydia: NEGATIVE
Comment: NEGATIVE
Comment: NEGATIVE
Comment: NORMAL
Neisseria Gonorrhea: NEGATIVE
Trichomonas: NEGATIVE

## 2019-09-11 ENCOUNTER — Ambulatory Visit (INDEPENDENT_AMBULATORY_CARE_PROVIDER_SITE_OTHER): Payer: BC Managed Care – PPO

## 2019-09-11 ENCOUNTER — Other Ambulatory Visit: Payer: Self-pay

## 2019-09-11 ENCOUNTER — Other Ambulatory Visit: Payer: BC Managed Care – PPO

## 2019-09-11 ENCOUNTER — Ambulatory Visit (INDEPENDENT_AMBULATORY_CARE_PROVIDER_SITE_OTHER): Payer: BC Managed Care – PPO | Admitting: Obstetrics and Gynecology

## 2019-09-11 VITALS — BP 132/70 | Wt 177.0 lb

## 2019-09-11 DIAGNOSIS — Z3A01 Less than 8 weeks gestation of pregnancy: Secondary | ICD-10-CM | POA: Diagnosis not present

## 2019-09-11 DIAGNOSIS — Z3401 Encounter for supervision of normal first pregnancy, first trimester: Secondary | ICD-10-CM

## 2019-09-11 DIAGNOSIS — N8312 Corpus luteum cyst of left ovary: Secondary | ICD-10-CM | POA: Diagnosis not present

## 2019-09-11 DIAGNOSIS — Z3400 Encounter for supervision of normal first pregnancy, unspecified trimester: Secondary | ICD-10-CM

## 2019-09-11 DIAGNOSIS — Z113 Encounter for screening for infections with a predominantly sexual mode of transmission: Secondary | ICD-10-CM

## 2019-09-11 DIAGNOSIS — O3481 Maternal care for other abnormalities of pelvic organs, first trimester: Secondary | ICD-10-CM

## 2019-09-11 DIAGNOSIS — Z6834 Body mass index (BMI) 34.0-34.9, adult: Secondary | ICD-10-CM

## 2019-09-11 LAB — POCT URINALYSIS DIPSTICK OB: POC,PROTEIN,UA: NEGATIVE

## 2019-09-11 NOTE — Progress Notes (Signed)
ROB/discuss Atarax pt is taking Dating scan  Early GTT

## 2019-09-11 NOTE — Progress Notes (Signed)
° ° °  Routine Prenatal Care Visit  Subjective  Angela Nunez is a 28 y.o. G1P0000 at [redacted]w[redacted]d being seen today for ongoing prenatal care.  She is currently monitored for the following issues for this low-risk pregnancy and has History of hypothyroidism; Situational anxiety; Supervision of normal first pregnancy, antepartum; Obesity affecting pregnancy, antepartum; and BMI 34.0-34.9,adult on their problem list.  ----------------------------------------------------------------------------------- Patient reports no complaints.   Contractions: Not present. Vag. Bleeding: None.  Movement: Absent. Denies leaking of fluid.  ----------------------------------------------------------------------------------- The following portions of the patient's history were reviewed and updated as appropriate: allergies, current medications, past family history, past medical history, past social history, past surgical history and problem list. Problem list updated.   Objective  Blood pressure 132/70, weight 177 lb (80.3 kg), last menstrual period 07/19/2019. Pregravid weight 180 lb (81.6 kg) Total Weight Gain -3 lb (-1.361 kg) Urinalysis:      Fetal Status: Fetal Heart Rate (bpm): 149   Movement: Absent     General:  Alert, oriented and cooperative. Patient is in no acute distress.  Skin: Skin is warm and dry. No rash noted.   Cardiovascular: Normal heart rate noted  Respiratory: Normal respiratory effort, no problems with respiration noted  Abdomen: Soft, gravid, appropriate for gestational age. Pain/Pressure: Absent     Pelvic:  Cervical exam deferred        Extremities: Normal range of motion.     ental Status: Normal mood and affect. Normal behavior. Normal judgment and thought content.   US Ob Comp Less 14 Wks  Result Date: 09/11/2019 Patient Name: DODI LEU DOB: [birth year] MRN: [id number] ULTRASOUND REPORT Location: [facility] Date of Service: 09/11/2019 Indications:dating Findings: Nelda Marse[place]  intrauterine pregnancy is visualized with a CRL consistent with [redacted]w[redacted]d gestation, giving an (U/S) EDD of 04/29/2020. The (U/S) EDD is consistent with the clinically established EDD of 04/24/2020. FHR: 149 BPM CRL measurement: 10.0 mm Yolk sac is visualized and appears normal and early anatomy is normal. Amnion: visualized and appears normal Right Ovary is normal in appearance. Left Ovary is normal appearance. Corpus luteal cyst:  Left ovary Survey of the adnexa demonstrates no adnexal masses. There is no free peritoneal fluid in the cul de sac. Impression: 1. [redacted]w[redacted]d Viable Singleton Intrauterine pregnancy by U/S. 2. (U/S) EDD is consistent with Clinically established EDD of 04/24/2020. Recommendations: 1.Clinical correlation with the patient's History and Physical Exam. Gweneth Dimitri, RT There is a viable singleton gestation.  The fetal biometry correlates with established dating. Detailed evaluation of the fetal anatomy is precluded by early gestational age.  It must be noted that a normal ultrasound particular at this early gestational age is unable to rule out fetal aneuploidy, risk of first trimester miscarriage, or anatomic birth defects. [name], MD, [facility] 09/11/2019, 10:58 AM     Assessment   28 y.o. G1P0000 at [redacted]w[redacted]d by  04/24/2020, by Last Menstrual Period presenting for routine prenatal visit  Plan   Gestational age appropriate obstetric precautions including but not limited to vaginal bleeding, contractions, leaking of fluid and fetal movement were reviewed in detail with the patient.    - [place] S=[id number] ab - early 1-hr and NOB labs today  Return in about 4 weeks (around 10/09/2019) for ROB.  [name], MD, [facility], [facility] 09/11/2019, 11:00 AM

## 2019-09-12 LAB — RPR+RH+ABO+RUB AB+AB SCR+CB...
Antibody Screen: NEGATIVE
HIV Screen 4th Generation wRfx: NONREACTIVE
Hematocrit: 43.1 % (ref 34.0–46.6)
Hemoglobin: 14.7 g/dL (ref 11.1–15.9)
Hepatitis B Surface Ag: NEGATIVE
MCH: 28.9 pg (ref 26.6–33.0)
MCHC: 34.1 g/dL (ref 31.5–35.7)
MCV: 85 fL (ref 79–97)
Platelets: 241 10*3/uL (ref 150–450)
RBC: 5.09 x10E6/uL (ref 3.77–5.28)
RDW: 12.9 % (ref 11.7–15.4)
RPR Ser Ql: NONREACTIVE
Rh Factor: POSITIVE
Rubella Antibodies, IGG: 1.74 index (ref >0.99)
Varicella zoster IgG: 981 index (ref >165)
WBC: 9.7 10*3/uL (ref 3.4–10.8)

## 2019-09-12 LAB — GLUCOSE, 1 HOUR GESTATIONAL: Gestational Diabetes Screen: 139 mg/dL (ref 65–139)

## 2019-10-09 ENCOUNTER — Other Ambulatory Visit: Payer: Self-pay

## 2019-10-09 ENCOUNTER — Encounter: Payer: Self-pay | Admitting: Maternal Newborn

## 2019-10-09 ENCOUNTER — Ambulatory Visit (INDEPENDENT_AMBULATORY_CARE_PROVIDER_SITE_OTHER): Payer: BC Managed Care – PPO | Admitting: Maternal Newborn

## 2019-10-09 VITALS — BP 130/80 | Wt 179.0 lb

## 2019-10-09 DIAGNOSIS — Z3A11 11 weeks gestation of pregnancy: Secondary | ICD-10-CM

## 2019-10-09 DIAGNOSIS — Z3401 Encounter for supervision of normal first pregnancy, first trimester: Secondary | ICD-10-CM

## 2019-10-09 DIAGNOSIS — Z3400 Encounter for supervision of normal first pregnancy, unspecified trimester: Secondary | ICD-10-CM

## 2019-10-09 DIAGNOSIS — Z1379 Encounter for other screening for genetic and chromosomal anomalies: Secondary | ICD-10-CM

## 2019-10-09 LAB — POCT URINALYSIS DIPSTICK OB
Glucose, UA: NEGATIVE
POC,PROTEIN,UA: NEGATIVE

## 2019-10-09 NOTE — Progress Notes (Signed)
    Routine Prenatal Care Visit  Subjective  Angela Nunez is a 28 y.o. G1P0000 at [redacted]w[redacted]d being seen today for ongoing prenatal care.  She is currently monitored for the following issues for this low-risk pregnancy and has History of hypothyroidism; Situational anxiety; Supervision of normal first pregnancy, antepartum; Obesity affecting pregnancy, antepartum; and BMI 34.0-34.9,adult on their problem list.  ----------------------------------------------------------------------------------- Patient reports no complaints.   Contractions: Not present. Vag. Bleeding: None.  No leaking of fluid.  ----------------------------------------------------------------------------------- The following portions of the patient's history were reviewed and updated as appropriate: allergies, current medications, past family history, past medical history, past social history, past surgical history and problem list. Problem list updated.  Objective  Blood pressure 130/80, weight 179 lb (81.2 kg), last menstrual period 07/19/2019. Pregravid weight 180 lb (81.6 kg) Total Weight Gain -1 lb (-0.454 kg)  Fetal Status: Fetal Heart Rate (bpm): 165         General:  Alert, oriented and cooperative. Patient is in no acute distress.  Skin: Skin is warm and dry. No rash noted.   Cardiovascular: Normal heart rate noted  Respiratory: Normal respiratory effort, no problems with respiration noted  Abdomen: Soft, gravid, appropriate for gestational age. Pain/Pressure: Absent     Pelvic:  Cervical exam deferred        Extremities: Normal range of motion.     Mental Status: Normal mood and affect. Normal behavior. Normal judgment and thought content.     Assessment   28 y.o. G1P0000 at [redacted]w[redacted]d, EDD 04/24/2020 by Last Menstrual Period presenting for a routine prenatal visit.  Plan   pregnancy Problems (from 08/29/19 to present)    Problem Noted Resolved   Supervision of normal first pregnancy, antepartum 08/29/2019 by  Rod Can, CNM No   Overview Addendum 09/11/2019 11:01 AM by [name], MD    [facility] Prenatal Labs  Dating LMP = 7 week [place] Blood type:     Genetic Screen 1 Screen:    AFP:     Quad:     NIPS: Antibody:   Anatomic [place]  Rubella:   Varicella: @VZVIGG @  GTT Early:               Third trimester:  RPR:     Rhogam  HBsAg:     Vaccines TDAP:                    Flu Shot: 08/29/19 HIV: Non Reactive (02/19 0930)   Baby Food  Breast                              GBS:   Contraception  Pap: 2019 neg  CBB     CS/VBAC  NA   Support Person Husband Martie Round          Obesity affecting pregnancy, antepartum 08/29/2019 by Rod Can, CNM No      MaterniT21 today.  Please refer to After Visit Summary for other counseling recommendations.   Return in about 4 weeks (around 11/06/2019) for ROB.  Avel Sensor, CNM 10/09/2019  8:31 AM

## 2019-10-09 NOTE — Patient Instructions (Signed)
First Trimester of Pregnancy The first trimester of pregnancy is from week 1 until the end of week 13 (months 1 through 3). A week after a sperm fertilizes an egg, the egg will implant on the wall of the uterus. This embryo will begin to develop into a baby. Genes from you and your partner will form the baby. The female genes will determine whether the baby will be a boy or a girl. At 6-8 weeks, the eyes and face will be formed, and the heartbeat can be seen on ultrasound. At the end of 12 weeks, all the baby's organs will be formed. Now that you are pregnant, you will want to do everything you can to have a healthy baby. Two of the most important things are to get good prenatal care and to follow your health care provider's instructions. Prenatal care is all the medical care you receive before the baby's birth. This care will help prevent, find, and treat any problems during the pregnancy and childbirth. Body changes during your first trimester Your body goes through many changes during pregnancy. The changes vary from woman to woman.  You may gain or lose a couple of pounds at first.  You may feel sick to your stomach (nauseous) and you may throw up (vomit). If the vomiting is uncontrollable, call your health care provider.  You may tire easily.  You may develop headaches that can be relieved by medicines. All medicines should be approved by your health care provider.  You may urinate more often. Painful urination may mean you have a bladder infection.  You may develop heartburn as a result of your pregnancy.  You may develop constipation because certain hormones are causing the muscles that push stool through your intestines to slow down.  You may develop hemorrhoids or swollen veins (varicose veins).  Your breasts may begin to grow larger and become tender. Your nipples may stick out more, and the tissue that surrounds them (areola) may become darker.  Your gums may bleed and may be  sensitive to brushing and flossing.  Dark spots or blotches (chloasma, mask of pregnancy) may develop on your face. This will likely fade after the baby is born.  Your menstrual periods will stop.  You may have a loss of appetite.  You may develop cravings for certain kinds of food.  You may have changes in your emotions from day to day, such as being excited to be pregnant or being concerned that something may go wrong with the pregnancy and baby.  You may have more vivid and strange dreams.  You may have changes in your hair. These can include thickening of your hair, rapid growth, and changes in texture. Some women also have hair loss during or after pregnancy, or hair that feels dry or thin. Your hair will most likely return to normal after your baby is born. What to expect at prenatal visits During a routine prenatal visit:  You will be weighed to make sure you and the baby are growing normally.  Your blood pressure will be taken.  Your abdomen will be measured to track your baby's growth.  The fetal heartbeat will be listened to between weeks 10 and 14 of your pregnancy.  Test results from any previous visits will be discussed. Your health care provider may ask you:  How you are feeling.  If you are feeling the baby move.  If you have had any abnormal symptoms, such as leaking fluid, bleeding, severe headaches, or abdominal   cramping.  If you are using any tobacco products, including cigarettes, chewing tobacco, and electronic cigarettes.  If you have any questions. Other tests that may be performed during your first trimester include:  Blood tests to find your blood type and to check for the presence of any previous infections. The tests will also be used to check for low iron levels (anemia) and protein on red blood cells (Rh antibodies). Depending on your risk factors, or if you previously had diabetes during pregnancy, you may have tests to check for high blood sugar  that affects pregnant women (gestational diabetes).  Urine tests to check for infections, diabetes, or protein in the urine.  An ultrasound to confirm the proper growth and development of the baby.  Fetal screens for spinal cord problems (spina bifida) and Down syndrome.  HIV (human immunodeficiency virus) testing. Routine prenatal testing includes screening for HIV, unless you choose not to have this test.  You may need other tests to make sure you and the baby are doing well. Follow these instructions at home: Medicines  Follow your health care provider's instructions regarding medicine use. Specific medicines may be either safe or unsafe to take during pregnancy.  Take a prenatal vitamin that contains at least 600 micrograms (mcg) of folic acid.  If you develop constipation, try taking a stool softener if your health care provider approves. Eating and drinking   Eat a balanced diet that includes fresh fruits and vegetables, whole grains, good sources of protein such as meat, eggs, or tofu, and low-fat dairy. Your health care provider will help you determine the amount of weight gain that is right for you.  Avoid raw meat and uncooked cheese. These carry germs that can cause birth defects in the baby.  Eating four or five small meals rather than three large meals a day may help relieve nausea and vomiting. If you start to feel nauseous, eating a few soda crackers can be helpful. Drinking liquids between meals, instead of during meals, also seems to help ease nausea and vomiting.  Limit foods that are high in fat and processed sugars, such as fried and sweet foods.  To prevent constipation: ? Eat foods that are high in fiber, such as fresh fruits and vegetables, whole grains, and beans. ? Drink enough fluid to keep your urine clear or pale yellow. Activity  Exercise only as directed by your health care provider. Most women can continue their usual exercise routine during  pregnancy. Try to exercise for 30 minutes at least 5 days a week. Exercising will help you: ? Control your weight. ? Stay in shape. ? Be prepared for labor and delivery.  Experiencing pain or cramping in the lower abdomen or lower back is a good sign that you should stop exercising. Check with your health care provider before continuing with normal exercises.  Try to avoid standing for long periods of time. Move your legs often if you must stand in one place for a long time.  Avoid heavy lifting.  Wear low-heeled shoes and practice good posture.  You may continue to have sex unless your health care provider tells you not to. Relieving pain and discomfort  Wear a good support bra to relieve breast tenderness.  Take warm sitz baths to soothe any pain or discomfort caused by hemorrhoids. Use hemorrhoid cream if your health care provider approves.  Rest with your legs elevated if you have leg cramps or low back pain.  If you develop varicose veins in   your legs, wear support hose. Elevate your feet for 15 minutes, 3-4 times a day. Limit salt in your diet. Prenatal care  Schedule your prenatal visits by the twelfth week of pregnancy. They are usually scheduled monthly at first, then more often in the last 2 months before delivery.  Write down your questions. Take them to your prenatal visits.  Keep all your prenatal visits as told by your health care provider. This is important. Safety  Wear your seat belt at all times when driving.  Make a list of emergency phone numbers, including numbers for family, friends, the hospital, and police and fire departments. General instructions  Ask your health care provider for a referral to a local prenatal education class. Begin classes no later than the beginning of month 6 of your pregnancy.  Ask for help if you have counseling or nutritional needs during pregnancy. Your health care provider can offer advice or refer you to specialists for help  with various needs.  Do not use hot tubs, steam rooms, or saunas.  Do not douche or use tampons or scented sanitary pads.  Do not cross your legs for long periods of time.  Avoid cat litter boxes and soil used by cats. These carry germs that can cause birth defects in the baby and possibly loss of the fetus by miscarriage or stillbirth.  Avoid all smoking, herbs, alcohol, and medicines not prescribed by your health care provider. Chemicals in these products affect the formation and growth of the baby.  Do not use any products that contain nicotine or tobacco, such as cigarettes and e-cigarettes. If you need help quitting, ask your health care provider. You may receive counseling support and other resources to help you quit.  Schedule a dentist appointment. At home, brush your teeth with a soft toothbrush and be gentle when you floss. Contact a health care provider if:  You have dizziness.  You have mild pelvic cramps, pelvic pressure, or nagging pain in the abdominal area.  You have persistent nausea, vomiting, or diarrhea.  You have a bad smelling vaginal discharge.  You have pain when you urinate.  You notice increased swelling in your face, hands, legs, or ankles.  You are exposed to fifth disease or chickenpox.  You are exposed to German measles (rubella) and have never had it. Get help right away if:  You have a fever.  You are leaking fluid from your vagina.  You have spotting or bleeding from your vagina.  You have severe abdominal cramping or pain.  You have rapid weight gain or loss.  You vomit blood or material that looks like coffee grounds.  You develop a severe headache.  You have shortness of breath.  You have any kind of trauma, such as from a fall or a car accident. Summary  The first trimester of pregnancy is from week 1 until the end of week 13 (months 1 through 3).  Your body goes through many changes during pregnancy. The changes vary from  woman to woman.  You will have routine prenatal visits. During those visits, your health care provider will examine you, discuss any test results you may have, and talk with you about how you are feeling. This information is not intended to replace advice given to you by your health care provider. Make sure you discuss any questions you have with your health care provider. Document Released: 11/02/2001 Document Revised: 10/21/2017 Document Reviewed: 10/20/2016 Elsevier Patient Education  2020 [employer]  

## 2019-10-09 NOTE — Addendum Note (Signed)
Addended by: Quintella Baton D on: 10/09/2019 08:43 AM   Modules accepted: Orders

## 2019-10-14 LAB — MATERNIT 21 PLUS CORE, BLOOD
Fetal Fraction: 8
Result (T21): NEGATIVE
Trisomy 13 (Patau syndrome): NEGATIVE
Trisomy 18 (Edwards syndrome): NEGATIVE
Trisomy 21 (Down syndrome): NEGATIVE

## 2019-10-16 ENCOUNTER — Telehealth: Payer: Self-pay

## 2019-10-16 NOTE — Telephone Encounter (Signed)
Spoke with patient and gave her results of genetic screen. Her mom has picked up envelope with gender information.

## 2019-10-16 NOTE — Telephone Encounter (Signed)
Please call pt with lab results. DO NOT TELL GENDER. I have printed for mom to pick up for gender reveal party

## 2019-11-06 ENCOUNTER — Ambulatory Visit (INDEPENDENT_AMBULATORY_CARE_PROVIDER_SITE_OTHER): Payer: BC Managed Care – PPO | Admitting: Certified Nurse Midwife

## 2019-11-06 ENCOUNTER — Other Ambulatory Visit: Payer: Self-pay

## 2019-11-06 VITALS — BP 130/70 | Temp 97.7°F | Wt 180.0 lb

## 2019-11-06 DIAGNOSIS — Z3402 Encounter for supervision of normal first pregnancy, second trimester: Secondary | ICD-10-CM

## 2019-11-06 DIAGNOSIS — Z3400 Encounter for supervision of normal first pregnancy, unspecified trimester: Secondary | ICD-10-CM

## 2019-11-06 DIAGNOSIS — Z1379 Encounter for other screening for genetic and chromosomal anomalies: Secondary | ICD-10-CM

## 2019-11-06 DIAGNOSIS — Z363 Encounter for antenatal screening for malformations: Secondary | ICD-10-CM

## 2019-11-06 DIAGNOSIS — Z3A15 15 weeks gestation of pregnancy: Secondary | ICD-10-CM

## 2019-11-06 LAB — POCT URINALYSIS DIPSTICK OB
Glucose, UA: NEGATIVE
POC,PROTEIN,UA: NEGATIVE

## 2019-11-06 NOTE — Progress Notes (Signed)
ROB at 15wk5d: Doing well. No vaginal bleeding. Has regained the weight she lost. Eating OK. Is not hungry in the evening. MaterniT 21 was diploid XX. FH at U-2/ FHTs WNL BP 130/70  Plan: MSAFP today Anatomy scan and ROB in 4 weeks.  Dalia Heading, CNM

## 2019-11-06 NOTE — Progress Notes (Signed)
No concerns.rj 

## 2019-11-08 LAB — AFP, SERUM, OPEN SPINA BIFIDA
AFP MoM: 1.88
AFP Value: 53.4 ng/mL
Gest. Age on Collection Date: 15.7 weeks
Maternal Age At EDD: 26.9 yr
OSBR Risk 1 IN: 1058
Test Results:: NEGATIVE
Weight: 180 [lb_av]

## 2019-11-23 NOTE — L&D Delivery Note (Signed)
Obstetrical Delivery Note   Date of Delivery:   04/27/2020 Primary OB:   [facility] Gestational Age/EDD: [redacted]w[redacted]d (Dated by Margo Aye and LMP) Antepartum complications: none  Delivered By:   Mirna Mires, CNM  Delivery Type:   spontaneous vaginal delivery  Procedure Details:   Induction at 40 weeks 2 days commenced with cervical ripening using Cytotec x 3 doses. Foley ball alos incorporated. On 04/27/2020 at approximately 0315,, the foley ball fell out and AROM occurred at  0550, with clear fluid seen. Pitocin infusion per protocol was then initiated, and the patient progressed to 4 cms when she had an epidural placed. She was completely dilated at 1730 and began pushing immediately. A viable female, 7 lbs 2 oz delivered LOA. One nuchal cord was reduced and with gentle downward traction, the shoulders delivered after the baby restituted to ROT. Infant to maternal abdomen for drying and tactile stimulation. Some initial decreased tone and lack of respiratory effort was noted; cord was double clamped and cut; infant was taken to the warmer for additional care, where she pinked up and showed improved respiratory effort.  Apgars 6, 8. Anesthesia:    epidural Intrapartum complications: None GBS:    YES Laceration:    none Episiotomy:    none Placenta:    Via active 3rd stage. To pathology: no Estimated Blood Loss:  * No surgery found *  Baby:    Liveborn female, Apgars 6/8, weight 7lbs 2oz  Infant was return to maternal abdomen for skin to skin care and initial breastfeeding. Mother and baby in stable condition.   Mirna Mires, CNM  04/27/2020 1930

## 2019-12-04 ENCOUNTER — Ambulatory Visit (INDEPENDENT_AMBULATORY_CARE_PROVIDER_SITE_OTHER): Payer: BC Managed Care – PPO

## 2019-12-04 ENCOUNTER — Other Ambulatory Visit: Payer: Self-pay

## 2019-12-04 ENCOUNTER — Ambulatory Visit (INDEPENDENT_AMBULATORY_CARE_PROVIDER_SITE_OTHER): Payer: BC Managed Care – PPO | Admitting: Certified Nurse Midwife

## 2019-12-04 VITALS — BP 130/80 | Wt 187.0 lb

## 2019-12-04 DIAGNOSIS — Z3A19 19 weeks gestation of pregnancy: Secondary | ICD-10-CM

## 2019-12-04 DIAGNOSIS — Z363 Encounter for antenatal screening for malformations: Secondary | ICD-10-CM

## 2019-12-04 DIAGNOSIS — Z3402 Encounter for supervision of normal first pregnancy, second trimester: Secondary | ICD-10-CM

## 2019-12-04 DIAGNOSIS — Z3400 Encounter for supervision of normal first pregnancy, unspecified trimester: Secondary | ICD-10-CM

## 2019-12-04 LAB — POCT URINALYSIS DIPSTICK (MANUAL)
Poct Glucose: 500 mg/dL — AB
Poct Protein: NEGATIVE mg/dL

## 2019-12-04 NOTE — Progress Notes (Signed)
No concerns.rj 

## 2019-12-04 NOTE — Progress Notes (Signed)
ROB at 19wk5d: Anatomy scan today. Doesn't think she has felt fetal movement yet, but saw baby moving on ultrasound. Taking prenatal vitamins.  CGA 19wk1d with normal anatomy, anterior placenta, breech, female gender ROB in 4 weeks  Farrel Conners, [place]

## 2019-12-12 ENCOUNTER — Other Ambulatory Visit: Payer: Self-pay

## 2019-12-13 ENCOUNTER — Ambulatory Visit: Payer: Self-pay | Attending: Internal Medicine

## 2019-12-13 DIAGNOSIS — Z20822 Contact with and (suspected) exposure to covid-19: Secondary | ICD-10-CM | POA: Insufficient documentation

## 2019-12-14 LAB — NOVEL CORONAVIRUS, NAA: SARS-CoV-2, NAA: NOT DETECTED

## 2019-12-15 ENCOUNTER — Encounter: Payer: Self-pay | Admitting: Physician Assistant

## 2020-01-01 ENCOUNTER — Other Ambulatory Visit: Payer: Self-pay

## 2020-01-01 ENCOUNTER — Ambulatory Visit (INDEPENDENT_AMBULATORY_CARE_PROVIDER_SITE_OTHER): Payer: BC Managed Care – PPO | Admitting: Obstetrics and Gynecology

## 2020-01-01 VITALS — BP 136/72 | Wt 188.0 lb

## 2020-01-01 DIAGNOSIS — Z3402 Encounter for supervision of normal first pregnancy, second trimester: Secondary | ICD-10-CM

## 2020-01-01 DIAGNOSIS — O99210 Obesity complicating pregnancy, unspecified trimester: Secondary | ICD-10-CM

## 2020-01-01 DIAGNOSIS — Z3A23 23 weeks gestation of pregnancy: Secondary | ICD-10-CM

## 2020-01-01 DIAGNOSIS — O99212 Obesity complicating pregnancy, second trimester: Secondary | ICD-10-CM

## 2020-01-01 DIAGNOSIS — Z8639 Personal history of other endocrine, nutritional and metabolic disease: Secondary | ICD-10-CM

## 2020-01-01 DIAGNOSIS — Z3400 Encounter for supervision of normal first pregnancy, unspecified trimester: Secondary | ICD-10-CM

## 2020-01-01 LAB — POCT URINALYSIS DIPSTICK OB
Glucose, UA: NEGATIVE
POC,PROTEIN,UA: NEGATIVE

## 2020-01-01 NOTE — Progress Notes (Signed)
ROB

## 2020-01-01 NOTE — Progress Notes (Signed)
Routine Prenatal Care Visit  Subjective  Angela Nunez is a 29 y.o. G1P0000 at [redacted]w[redacted]d being seen today for ongoing prenatal care.  She is currently monitored for the following issues for this low-risk pregnancy and has History of hypothyroidism; Situational anxiety; Supervision of normal first pregnancy, antepartum; Obesity affecting pregnancy, antepartum; and BMI 34.0-34.9,adult on their problem list.  ----------------------------------------------------------------------------------- Patient reports no complaints.  Still not feeling very consistent movement mostly in AM Contractions: Not present. Vag. Bleeding: None.  Movement: Present. Denies leaking of fluid.  ----------------------------------------------------------------------------------- The following portions of the patient's history were reviewed and updated as appropriate: allergies, current medications, past family history, past medical history, past social history, past surgical history and problem list. Problem list updated.   Objective  Blood pressure 136/72, weight 188 lb (85.3 kg), last menstrual period 07/19/2019. Pregravid weight 180 lb (81.6 kg) Total Weight Gain 8 lb (3.629 kg)  Body mass index is 35.52 kg/m.  Urinalysis:      Fetal Status: Fetal Heart Rate (bpm): 145   Movement: Present  Presentation: Vertex  General:  Alert, oriented and cooperative. Patient is in no acute distress.  Skin: Skin is warm and dry. No rash noted.   Cardiovascular: Normal heart rate noted  Respiratory: Normal respiratory effort, no problems with respiration noted  Abdomen: Soft, gravid, appropriate for gestational age. Pain/Pressure: Absent     Pelvic:  Cervical exam deferred        Extremities: Normal range of motion.     ental Status: Normal mood and affect. Normal behavior. Normal judgment and thought content.     Assessment   29 y.o. G1P0000 at [redacted]w[redacted]d by  04/24/2020, by Last Menstrual Period presenting for routine prenatal  visit  Plan   pregnancy Problems (from 08/29/19 to present)    Problem Noted Resolved   Supervision of normal first pregnancy, antepartum 08/29/2019 by Rod Can, CNM No   Overview Addendum 12/04/2019  7:47 PM by Dalia Heading, [place] Prenatal Labs  Dating LMP = 7 week [place] Blood type: A/Positive/-- (10/20 1122)   Genetic Screen NIPS:Normal XY; MSAFP negative Antibody:Negative (10/20 1122)  Anatomic [place] Normal anatomy, female gender, anterior placenta Rubella: 1.74 (10/20 1122) Varicella:[place]  GTT Early:  139             Third trimester:  RPR: Non Reactive (10/20 1122)   Rhogam [ ]  28 weeks HBsAg: Negative (10/20 1122)   Vaccines TDAP:                    Flu Shot: 08/29/19 HIV: Non Reactive (10/20 1122)   Baby Food  Breast                              GBS:   Contraception  Pap: 2019 neg  CBB     CS/VBAC  NA   Support Person Husband Hayden          Obesity affecting pregnancy, antepartum 08/29/2019 by Rod Can, CNM No       Gestational age appropriate obstetric precautions including but not limited to vaginal bleeding, contractions, leaking of fluid and fetal movement were reviewed in detail with the patient.    - 28 week labs and repeat thyroid testing next visit  Return in about 4 weeks (around 01/29/2020) for ROB and 28 week labs.  [name], MD, [facility], [facility] 01/01/2020, 8:31  AM  '

## 2020-01-16 ENCOUNTER — Encounter: Payer: Self-pay | Admitting: Physician Assistant

## 2020-01-29 ENCOUNTER — Ambulatory Visit (INDEPENDENT_AMBULATORY_CARE_PROVIDER_SITE_OTHER): Payer: BC Managed Care – PPO | Admitting: Certified Nurse Midwife

## 2020-01-29 ENCOUNTER — Other Ambulatory Visit: Payer: Self-pay

## 2020-01-29 ENCOUNTER — Other Ambulatory Visit: Payer: BC Managed Care – PPO

## 2020-01-29 VITALS — BP 128/80 | Temp 97.8°F | Wt 194.0 lb

## 2020-01-29 DIAGNOSIS — Z3400 Encounter for supervision of normal first pregnancy, unspecified trimester: Secondary | ICD-10-CM

## 2020-01-29 DIAGNOSIS — Z3402 Encounter for supervision of normal first pregnancy, second trimester: Secondary | ICD-10-CM

## 2020-01-29 DIAGNOSIS — Z8639 Personal history of other endocrine, nutritional and metabolic disease: Secondary | ICD-10-CM

## 2020-01-29 DIAGNOSIS — Z3A27 27 weeks gestation of pregnancy: Secondary | ICD-10-CM

## 2020-01-29 DIAGNOSIS — O99210 Obesity complicating pregnancy, unspecified trimester: Secondary | ICD-10-CM

## 2020-01-29 LAB — POCT URINALYSIS DIPSTICK OB
Glucose, UA: NEGATIVE
POC,PROTEIN,UA: NEGATIVE

## 2020-01-29 NOTE — Progress Notes (Signed)
No concerns, rj 

## 2020-01-29 NOTE — Lactation Note (Signed)
Lactation Consultation Note  Patient Name: Angela Nunez VCBSW'H Date: 01/29/2020      Consult Status   Lactation student discussed benefits of breastfeeding per the Ready, Set, Baby curriculum. [employer] encouraged to review breastfeeding information on Ready, set, [employer] site and given information for virtual breastfeeding classes.     Willa Rough Artis Buechele 01/29/2020, 9:21 AM

## 2020-01-30 ENCOUNTER — Encounter: Payer: Self-pay | Admitting: Physician Assistant

## 2020-01-30 LAB — 28 WEEK RH+PANEL
Basophils Absolute: 0 10*3/uL (ref 0.0–0.2)
Basos: 0 %
EOS (ABSOLUTE): 0.1 10*3/uL (ref 0.0–0.4)
Eos: 1 %
Gestational Diabetes Screen: 122 mg/dL (ref 65–139)
HIV Screen 4th Generation wRfx: NONREACTIVE
Hematocrit: 40.2 % (ref 34.0–46.6)
Hemoglobin: 13.7 g/dL (ref 11.1–15.9)
Immature Grans (Abs): 0 10*3/uL (ref 0.0–0.1)
Immature Granulocytes: 0 %
Lymphocytes Absolute: 1.6 10*3/uL (ref 0.7–3.1)
Lymphs: 15 %
MCH: 29.7 pg (ref 26.6–33.0)
MCHC: 34.1 g/dL (ref 31.5–35.7)
MCV: 87 fL (ref 79–97)
Monocytes Absolute: 0.4 10*3/uL (ref 0.1–0.9)
Monocytes: 4 %
Neutrophils Absolute: 8.3 10*3/uL — ABNORMAL HIGH (ref 1.4–7.0)
Neutrophils: 80 %
Platelets: 193 10*3/uL (ref 150–450)
RBC: 4.62 x10E6/uL (ref 3.77–5.28)
RDW: 13.2 % (ref 11.7–15.4)
RPR Ser Ql: NONREACTIVE
WBC: 10.4 10*3/uL (ref 3.4–10.8)

## 2020-01-30 LAB — THYROID PANEL WITH TSH
Free Thyroxine Index: 0.8 — ABNORMAL LOW (ref 1.2–4.9)
T3 Uptake Ratio: 9 % — ABNORMAL LOW (ref 24–39)
T4, Total: 9 ug/dL (ref 4.5–12.0)
TSH: 1.74 u[IU]/mL (ref 0.450–4.500)

## 2020-02-03 NOTE — Progress Notes (Signed)
ROB at 27wk5d: Doing well. Has 28 week labs today. Blood type A POS. Good FM Desires to breast feed  FH: 28cm/ FHT 140/ Weight up 6# in the last month, bit TWG 14#  Spoke with lactation today ROB in 2 weeks  Farrel Conners, CNM

## 2020-02-12 ENCOUNTER — Ambulatory Visit (INDEPENDENT_AMBULATORY_CARE_PROVIDER_SITE_OTHER): Payer: BC Managed Care – PPO | Admitting: Certified Nurse Midwife

## 2020-02-12 ENCOUNTER — Other Ambulatory Visit: Payer: Self-pay

## 2020-02-12 VITALS — BP 130/80 | Wt 198.0 lb

## 2020-02-12 DIAGNOSIS — O26843 Uterine size-date discrepancy, third trimester: Secondary | ICD-10-CM

## 2020-02-12 DIAGNOSIS — Z3400 Encounter for supervision of normal first pregnancy, unspecified trimester: Secondary | ICD-10-CM

## 2020-02-12 DIAGNOSIS — Z3A29 29 weeks gestation of pregnancy: Secondary | ICD-10-CM

## 2020-02-12 LAB — POCT URINALYSIS DIPSTICK OB: POC,PROTEIN,UA: NEGATIVE

## 2020-02-12 NOTE — Progress Notes (Signed)
ROB at 29wk5d: Baby active. Wants to breast feed. Having some pain in lower abdomen with certain movements-probable round ligament pain.  Has had borderline elevated blood pressures in the past. Not ever diagnosed with CHTN. Gets a little nervous when she comes for visits-thinks that may be a little "white coat syndrome" 28 week labs WNL. 1 hour GTT 122.  BP 130/80, TWG 18#. Blood pressures prior to pregnancy in the 120s/80s FH 32cm, FHT 145  A: IUP at 29wk5d with S>D ? CHTN P: Growth scan and ROB next visit in 2 weeks Discussed getting TDAP next visit. Monitor blood pressures  Farrel Conners, CNM

## 2020-02-12 NOTE — Progress Notes (Signed)
No concerns.rj 

## 2020-02-21 NOTE — Progress Notes (Signed)
Patient: Angela Nunez, Female    DOB: [birth year], 29 y.o.   MRN: [id number] Visit Date: 02/22/2020  Today's Provider: Mar Daring, PA-C   Chief Complaint  Patient presents with  . Annual Exam   Subjective:     Annual physical exam Angela Nunez is a 29 y.o. female who presents today for health maintenance and complete physical. She feels well. She reports exercising none. She reports she is sleeping fairly well. ----------------------------------------------------------------- Pap: Followed by [facility]; currently pregnant ([redacted]w[redacted]d; having a baby girl and very excited  Review of Systems  Constitutional: Negative.   HENT: Negative.   Eyes: Negative.   Respiratory: Negative.   Cardiovascular: Negative.   Gastrointestinal: Negative.   Endocrine: Negative.   Genitourinary: Negative.   Musculoskeletal: Negative.   Skin: Negative.   Allergic/Immunologic: Negative.   Neurological: Negative.   Hematological: Negative.   Psychiatric/Behavioral: Negative.     Social History      She  reports that she has never smoked. She has never used smokeless tobacco. She reports previous alcohol use. She reports that she does not use drugs.       Social History   Socioeconomic History  . Marital status: Married    Spouse name: Not on file  . Number of children: Not on file  . Years of education: Not on file  . Highest education level: Not on file  Occupational History  . Not on file  Tobacco Use  . Smoking status: Never Smoker  . Smokeless tobacco: Never Used  Substance and Sexual Activity  . Alcohol use: Not Currently    Comment: Once or twice a month  . Drug use: No  . Sexual activity: Yes    Birth control/protection: None  Other Topics Concern  . Not on file  Social History Narrative  . Not on file   Social Determinants of Health   Financial Resource Strain:   . Difficulty of Paying Living Expenses:   Food Insecurity:   . Worried About RCharity fundraiser in the Last Year:   . RArboriculturistin the Last Year:   Transportation Needs:   . LFilm/video editor(Medical):   .Marland KitchenLack of Transportation (Non-Medical):   Physical Activity:   . Days of Exercise per Week:   . Minutes of Exercise per Session:   Stress:   . Feeling of Stress :   Social Connections:   . Frequency of Communication with Friends and Family:   . Frequency of Social Gatherings with Friends and Family:   . Attends Religious Services:   . Active Member of Clubs or Organizations:   . Attends CArchivistMeetings:   .Marland KitchenMarital Status:     Past Medical History:  Diagnosis Date  . Hypothyroidism   . Vaccine for human papilloma virus (HPV) types 6, 11, 16, and 18 administered      Patient Active Problem List   Diagnosis Date Noted  . Supervision of normal first pregnancy, antepartum 08/29/2019  . Obesity affecting pregnancy, antepartum 08/29/2019  . BMI 34.0-34.9,adult 08/29/2019  . Situational anxiety 01/10/2019  . History of hypothyroidism 02/06/2016    Past Surgical History:  Procedure Laterality Date  . WISDOM TOOTH EXTRACTION     x4 extracted     Family History        Family Status  Relation Name Status  . Mother  Alive  . Father  Alive  . Other MGGM (  Not Specified)        Her family history includes Breast cancer in an other family member.      No Known Allergies   Current Outpatient Medications:  .  augmented betamethasone dipropionate (DIPROLENE-AF) 0.05 % ointment, , Disp: , Rfl:  .  hydrOXYzine (ATARAX/VISTARIL) 25 MG tablet, Take 25 mg by mouth 3 (three) times daily as needed., Disp: , Rfl:  .  Prenatal Vit-Fe Fumarate-FA (PRENATAL VITAMIN PO), Take by mouth., Disp: , Rfl:    Patient Care Team: Rubye Beach as PCP - General (Family Medicine)    Objective:    Vitals: BP 125/77 (BP Location: Left Arm, Patient Position: Sitting, Cuff Size: Large)   Pulse (!) 102   Temp (!) 97.1 F (36.2 C) (Temporal)   Resp  16   Ht 5' 1"  (1.549 m)   Wt 195 lb 12.8 oz (88.8 kg)   LMP 07/19/2019 (Exact Date)   BMI 37.00 kg/m    Vitals:   02/22/20 0810  BP: 125/77  Pulse: (!) 102  Resp: 16  Temp: (!) 97.1 F (36.2 C)  TempSrc: Temporal  Weight: 195 lb 12.8 oz (88.8 kg)  Height: 5' 1"  (1.549 m)     Physical Exam Constitutional:      General: She is not in acute distress.    Appearance: Normal appearance. She is obese. She is not ill-appearing.  HENT:     Head: Normocephalic and atraumatic.     Right Ear: Tympanic membrane, ear canal and external ear normal.     Left Ear: Tympanic membrane, ear canal and external ear normal.  Eyes:     General: No scleral icterus.       Right eye: No discharge.        Left eye: No discharge.     Extraocular Movements: Extraocular movements intact.     Conjunctiva/sclera: Conjunctivae normal.     Pupils: Pupils are equal, round, and reactive to light.  Cardiovascular:     Rate and Rhythm: Normal rate and regular rhythm.     Pulses: Normal pulses.     Heart sounds: Normal heart sounds.  Pulmonary:     Effort: Pulmonary effort is normal.     Breath sounds: Normal breath sounds.  Abdominal:     General: Abdomen is protuberant. Bowel sounds are normal.     Palpations: Abdomen is soft.     Tenderness: There is abdominal tenderness (mild lower abdominal pain; suspected to be round ligament pain starting).     Comments: pregnant  Genitourinary:    Comments: Deferred to OB/GYN Musculoskeletal:        General: Normal range of motion.     Cervical back: Normal range of motion and neck supple.  Skin:    General: Skin is warm and dry.     Capillary Refill: Capillary refill takes less than 2 seconds.  Neurological:     General: No focal deficit present.     Mental Status: She is alert and oriented to person, place, and time. Mental status is at baseline.  Psychiatric:        Mood and Affect: Mood normal.        Behavior: Behavior normal.        Thought Content:  Thought content normal.        Judgment: Judgment normal.      Depression Screen PHQ 2/9 Scores 02/22/2020 01/10/2019 07/01/2017  PHQ - 2 Score 0 0 0  PHQ- 9 Score 0  4 0       Assessment & Plan:     Routine Health Maintenance and Physical Exam  Exercise Activities and Dietary recommendations Goals   None     Immunization History  Administered Date(s) Administered  . DTaP 07/06/1993, 09/17/1993, 03/31/1994, 05/09/1995, 07/17/1997  . HPV 9-valent 07/08/2006, 05/03/2007, 10/13/2007  . Hepatitis A 07/08/2006, 05/03/2007  . Hepatitis B 1993/11/13, 07/06/1993, 04/02/1994  . HiB (PRP-OMP) 07/06/1993, 09/17/1993, 03/31/1994  . IPV 07/06/1993, 09/17/1993, 03/31/1994, 05/09/1995, 07/17/1997  . Influenza,inj,Quad PF,6+ Mos 08/29/2019  . Influenza-Unspecified 09/03/2015, 11/03/2016  . MMR 05/07/1995, 07/17/1997  . Meningococcal Conjugate 06/27/2008  . Meningococcal Mcv4o 08/30/2011  . Td 07/08/2006, 07/01/2017  . Tdap 07/08/2006  . Varicella 08/30/2011    Health Maintenance  Topic Date Due  . INFLUENZA VACCINE  06/22/2020  . PAP-Cervical Cytology Screening  01/18/2021  . PAP SMEAR-Modifier  01/18/2021  . TETANUS/TDAP  07/02/2027  . HIV Screening  Completed     Discussed health benefits of physical activity, and encouraged her to engage in regular exercise appropriate for her age and condition.    1. Annual physical exam Normal exam. Labs from OB reviewed today. They are monitoring her glucose levels and she reports she has glucose checked again next week. Also monitoring BP. Had been borderline at her last OB appt, but today BP is normal at 125/77. Will continue to monitor. Thyroid levels are ok. Discussed low T3 and patient already on Zinc supplement (23m). Call if any acute issue arises. Will see her back in 1 year for next CPE.   2. [redacted] weeks gestation of pregnancy Followed by [facility]  --------------------------------------------------------------------    JMar Daring PA-C  [email or web address]

## 2020-02-22 ENCOUNTER — Ambulatory Visit (INDEPENDENT_AMBULATORY_CARE_PROVIDER_SITE_OTHER): Payer: BC Managed Care – PPO | Admitting: Physician Assistant

## 2020-02-22 ENCOUNTER — Other Ambulatory Visit: Payer: Self-pay

## 2020-02-22 ENCOUNTER — Encounter: Payer: Self-pay | Admitting: Physician Assistant

## 2020-02-22 VITALS — BP 125/77 | HR 102 | Temp 97.1°F | Resp 16 | Ht 61.0 in | Wt 195.8 lb

## 2020-02-22 DIAGNOSIS — Z0000 Encounter for general adult medical examination without abnormal findings: Secondary | ICD-10-CM

## 2020-02-22 DIAGNOSIS — Z3A31 31 weeks gestation of pregnancy: Secondary | ICD-10-CM

## 2020-02-22 NOTE — Patient Instructions (Signed)
Health Maintenance, Female Adopting a healthy lifestyle and getting preventive care are important in promoting health and wellness. Ask your health care provider about:  The right schedule for you to have regular tests and exams.  Things you can do on your own to prevent diseases and keep yourself healthy. What should I know about diet, weight, and exercise? Eat a healthy diet   Eat a diet that includes plenty of vegetables, fruits, low-fat dairy products, and lean protein.  Do not eat a lot of foods that are high in solid fats, added sugars, or sodium. Maintain a healthy weight Body mass index (BMI) is used to identify weight problems. It estimates body fat based on height and weight. Your health care provider can help determine your BMI and help you achieve or maintain a healthy weight. Get regular exercise Get regular exercise. This is one of the most important things you can do for your health. Most adults should:  Exercise for at least 150 minutes each week. The exercise should increase your heart rate and make you sweat (moderate-intensity exercise).  Do strengthening exercises at least twice a week. This is in addition to the moderate-intensity exercise.  Spend less time sitting. Even light physical activity can be beneficial. Watch cholesterol and blood lipids Have your blood tested for lipids and cholesterol at 27 years of age, then have this test every 5 years. Have your cholesterol levels checked more often if:  Your lipid or cholesterol levels are high.  You are older than 27 years of age.  You are at high risk for heart disease. What should I know about cancer screening? Depending on your health history and family history, you may need to have cancer screening at various ages. This may include screening for:  Breast cancer.  Cervical cancer.  Colorectal cancer.  Skin cancer.  Lung cancer. What should I know about heart disease, diabetes, and high blood  pressure? Blood pressure and heart disease  High blood pressure causes heart disease and increases the risk of stroke. This is more likely to develop in people who have high blood pressure readings, are of African descent, or are overweight.  Have your blood pressure checked: ? Every 3-5 years if you are 18-39 years of age. ? Every year if you are 40 years old or older. Diabetes Have regular diabetes screenings. This checks your fasting blood sugar level. Have the screening done:  Once every three years after age 40 if you are at a normal weight and have a low risk for diabetes.  More often and at a younger age if you are overweight or have a high risk for diabetes. What should I know about preventing infection? Hepatitis B If you have a higher risk for hepatitis B, you should be screened for this virus. Talk with your health care provider to find out if you are at risk for hepatitis B infection. Hepatitis C Testing is recommended for:  Everyone born from 1945 through 1965.  Anyone with known risk factors for hepatitis C. Sexually transmitted infections (STIs)  Get screened for STIs, including gonorrhea and chlamydia, if: ? You are sexually active and are younger than 27 years of age. ? You are older than 27 years of age and your health care provider tells you that you are at risk for this type of infection. ? Your sexual activity has changed since you were last screened, and you are at increased risk for chlamydia or gonorrhea. Ask your health care provider if   you are at risk.  Ask your health care provider about whether you are at high risk for HIV. Your health care provider may recommend a prescription medicine to help prevent HIV infection. If you choose to take medicine to prevent HIV, you should first get tested for HIV. You should then be tested every 3 months for as long as you are taking the medicine. Pregnancy  If you are about to stop having your period (premenopausal) and  you may become pregnant, seek counseling before you get pregnant.  Take 400 to 800 micrograms (mcg) of folic acid every day if you become pregnant.  Ask for birth control (contraception) if you want to prevent pregnancy. Osteoporosis and menopause Osteoporosis is a disease in which the bones lose minerals and strength with aging. This can result in bone fractures. If you are 65 years old or older, or if you are at risk for osteoporosis and fractures, ask your health care provider if you should:  Be screened for bone loss.  Take a calcium or vitamin D supplement to lower your risk of fractures.  Be given hormone replacement therapy (HRT) to treat symptoms of menopause. Follow these instructions at home: Lifestyle  Do not use any products that contain nicotine or tobacco, such as cigarettes, e-cigarettes, and chewing tobacco. If you need help quitting, ask your health care provider.  Do not use street drugs.  Do not share needles.  Ask your health care provider for help if you need support or information about quitting drugs. Alcohol use  Do not drink alcohol if: ? Your health care provider tells you not to drink. ? You are pregnant, may be pregnant, or are planning to become pregnant.  If you drink alcohol: ? Limit how much you use to 0-1 drink a day. ? Limit intake if you are breastfeeding.  Be aware of how much alcohol is in your drink. In the [place] one drink equals one 12 oz bottle of beer (355 mL), one 5 oz glass of wine (148 mL), or one 1 oz glass of hard liquor (44 mL). General instructions  Schedule regular health, dental, and eye exams.  Stay current with your vaccines.  Tell your health care provider if: ? You often feel depressed. ? You have ever been abused or do not feel safe at home. Summary  Adopting a healthy lifestyle and getting preventive care are important in promoting health and wellness.  Follow your health care provider's instructions about healthy  diet, exercising, and getting tested or screened for diseases.  Follow your health care provider's instructions on monitoring your cholesterol and blood pressure. This information is not intended to replace advice given to you by your health care provider. Make sure you discuss any questions you have with your health care provider. Document Revised: 11/01/2018 Document Reviewed: 11/01/2018 [employer]vier Patient Education  2020 [employer]  

## 2020-02-26 ENCOUNTER — Ambulatory Visit (INDEPENDENT_AMBULATORY_CARE_PROVIDER_SITE_OTHER): Payer: BC Managed Care – PPO | Admitting: Advanced Practice Midwife

## 2020-02-26 ENCOUNTER — Ambulatory Visit (INDEPENDENT_AMBULATORY_CARE_PROVIDER_SITE_OTHER): Payer: BC Managed Care – PPO

## 2020-02-26 ENCOUNTER — Encounter: Payer: Self-pay | Admitting: Advanced Practice Midwife

## 2020-02-26 ENCOUNTER — Other Ambulatory Visit: Payer: Self-pay

## 2020-02-26 VITALS — BP 126/84 | Wt 196.0 lb

## 2020-02-26 DIAGNOSIS — Z362 Encounter for other antenatal screening follow-up: Secondary | ICD-10-CM | POA: Diagnosis not present

## 2020-02-26 DIAGNOSIS — O26843 Uterine size-date discrepancy, third trimester: Secondary | ICD-10-CM

## 2020-02-26 DIAGNOSIS — Z23 Encounter for immunization: Secondary | ICD-10-CM

## 2020-02-26 DIAGNOSIS — Z3A31 31 weeks gestation of pregnancy: Secondary | ICD-10-CM

## 2020-02-26 DIAGNOSIS — O365930 Maternal care for other known or suspected poor fetal growth, third trimester, not applicable or unspecified: Secondary | ICD-10-CM | POA: Diagnosis not present

## 2020-02-26 DIAGNOSIS — Z3400 Encounter for supervision of normal first pregnancy, unspecified trimester: Secondary | ICD-10-CM

## 2020-02-26 NOTE — Patient Instructions (Signed)
Third Trimester of Pregnancy The third trimester is from week 28 through week 40 (months 7 through 9). The third trimester is a time when the unborn baby (fetus) is growing rapidly. At the end of the ninth month, the fetus is about 20 inches in length and weighs 6-10 pounds. Body changes during your third trimester Your body will continue to go through many changes during pregnancy. The changes vary from woman to woman. During the third trimester:  Your weight will continue to increase. You can expect to gain 25-35 pounds (11-16 kg) by the end of the pregnancy.  You may begin to get stretch marks on your hips, abdomen, and breasts.  You may urinate more often because the fetus is moving lower into your pelvis and pressing on your bladder.  You may develop or continue to have heartburn. This is caused by increased hormones that slow down muscles in the digestive tract.  You may develop or continue to have constipation because increased hormones slow digestion and cause the muscles that push waste through your intestines to relax.  You may develop hemorrhoids. These are swollen veins (varicose veins) in the rectum that can itch or be painful.  You may develop swollen, bulging veins (varicose veins) in your legs.  You may have increased body aches in the pelvis, back, or thighs. This is due to weight gain and increased hormones that are relaxing your joints.  You may have changes in your hair. These can include thickening of your hair, rapid growth, and changes in texture. Some women also have hair loss during or after pregnancy, or hair that feels dry or thin. Your hair will most likely return to normal after your baby is born.  Your breasts will continue to grow and they will continue to become tender. A yellow fluid (colostrum) may leak from your breasts. This is the first milk you are producing for your baby.  Your belly button may stick out.  You may notice more swelling in your hands,  face, or ankles.  You may have increased tingling or numbness in your hands, arms, and legs. The skin on your belly may also feel numb.  You may feel short of breath because of your expanding uterus.  You may have more problems sleeping. This can be caused by the size of your belly, increased need to urinate, and an increase in your body's metabolism.  You may notice the fetus "dropping," or moving lower in your abdomen (lightening).  You may have increased vaginal discharge.  You may notice your joints feel loose and you may have pain around your pelvic bone. What to expect at prenatal visits You will have prenatal exams every 2 weeks until week 36. Then you will have weekly prenatal exams. During a routine prenatal visit:  You will be weighed to make sure you and the baby are growing normally.  Your blood pressure will be taken.  Your abdomen will be measured to track your baby's growth.  The fetal heartbeat will be listened to.  Any test results from the previous visit will be discussed.  You may have a cervical check near your due date to see if your cervix has softened or thinned (effaced).  You will be tested for Group B streptococcus. This happens between 35 and 37 weeks. Your health care provider may ask you:  What your birth plan is.  How you are feeling.  If you are feeling the baby move.  If you have had any abnormal   symptoms, such as leaking fluid, bleeding, severe headaches, or abdominal cramping.  If you are using any tobacco products, including cigarettes, chewing tobacco, and electronic cigarettes.  If you have any questions. Other tests or screenings that may be performed during your third trimester include:  Blood tests that check for low iron levels (anemia).  Fetal testing to check the health, activity level, and growth of the fetus. Testing is done if you have certain medical conditions or if there are problems during the pregnancy.  Nonstress test  (NST). This test checks the health of your baby to make sure there are no signs of problems, such as the baby not getting enough oxygen. During this test, a belt is placed around your belly. The baby is made to move, and its heart rate is monitored during movement. What is false labor? False labor is a condition in which you feel small, irregular tightenings of the muscles in the womb (contractions) that usually go away with rest, changing position, or drinking water. These are called Braxton Hicks contractions. Contractions may last for hours, days, or even weeks before true labor sets in. If contractions come at regular intervals, become more frequent, increase in intensity, or become painful, you should see your health care provider. What are the signs of labor?  Abdominal cramps.  Regular contractions that start at 10 minutes apart and become stronger and more frequent with time.  Contractions that start on the top of the uterus and spread down to the lower abdomen and back.  Increased pelvic pressure and dull back pain.  A watery or bloody mucus discharge that comes from the vagina.  Leaking of amniotic fluid. This is also known as your "water breaking." It could be a slow trickle or a gush. Let your health care provider know if it has a color or strange odor. If you have any of these signs, call your health care provider right away, even if it is before your due date. Follow these instructions at home: Medicines  Follow your health care provider's instructions regarding medicine use. Specific medicines may be either safe or unsafe to take during pregnancy.  Take a prenatal vitamin that contains at least 600 micrograms (mcg) of folic acid.  If you develop constipation, try taking a stool softener if your health care provider approves. Eating and drinking   Eat a balanced diet that includes fresh fruits and vegetables, whole grains, good sources of protein such as meat, eggs, or tofu,  and low-fat dairy. Your health care provider will help you determine the amount of weight gain that is right for you.  Avoid raw meat and uncooked cheese. These carry germs that can cause birth defects in the baby.  If you have low calcium intake from food, talk to your health care provider about whether you should take a daily calcium supplement.  Eat four or five small meals rather than three large meals a day.  Limit foods that are high in fat and processed sugars, such as fried and sweet foods.  To prevent constipation: ? Drink enough fluid to keep your urine clear or pale yellow. ? Eat foods that are high in fiber, such as fresh fruits and vegetables, whole grains, and beans. Activity  Exercise only as directed by your health care provider. Most women can continue their usual exercise routine during pregnancy. Try to exercise for 30 minutes at least 5 days a week. Stop exercising if you experience uterine contractions.  Avoid heavy lifting.  Do   not exercise in extreme heat or humidity, or at high altitudes.  Wear low-heel, comfortable shoes.  Practice good posture.  You may continue to have sex unless your health care provider tells you otherwise. Relieving pain and discomfort  Take frequent breaks and rest with your legs elevated if you have leg cramps or low back pain.  Take warm sitz baths to soothe any pain or discomfort caused by hemorrhoids. Use hemorrhoid cream if your health care provider approves.  Wear a good support bra to prevent discomfort from breast tenderness.  If you develop varicose veins: ? Wear support pantyhose or compression stockings as told by your healthcare provider. ? Elevate your feet for 15 minutes, 3-4 times a day. Prenatal care  Write down your questions. Take them to your prenatal visits.  Keep all your prenatal visits as told by your health care provider. This is important. Safety  Wear your seat belt at all times when driving.  Make  a list of emergency phone numbers, including numbers for family, friends, the hospital, and police and fire departments. General instructions  Avoid cat litter boxes and soil used by cats. These carry germs that can cause birth defects in the baby. If you have a cat, ask someone to clean the litter box for you.  Do not travel far distances unless it is absolutely necessary and only with the approval of your health care provider.  Do not use hot tubs, steam rooms, or saunas.  Do not drink alcohol.  Do not use any products that contain nicotine or tobacco, such as cigarettes and e-cigarettes. If you need help quitting, ask your health care provider.  Do not use any medicinal herbs or unprescribed drugs. These chemicals affect the formation and growth of the baby.  Do not douche or use tampons or scented sanitary pads.  Do not cross your legs for long periods of time.  To prepare for the arrival of your baby: ? Take prenatal classes to understand, practice, and ask questions about labor and delivery. ? Make a trial run to the hospital. ? Visit the hospital and tour the maternity area. ? Arrange for maternity or paternity leave through employers. ? Arrange for family and friends to take care of pets while you are in the hospital. ? Purchase a rear-facing car seat and make sure you know how to install it in your car. ? Pack your hospital bag. ? Prepare the baby's nursery. Make sure to remove all pillows and stuffed animals from the baby's crib to prevent suffocation.  Visit your dentist if you have not gone during your pregnancy. Use a soft toothbrush to brush your teeth and be gentle when you floss. Contact a health care provider if:  You are unsure if you are in labor or if your water has broken.  You become dizzy.  You have mild pelvic cramps, pelvic pressure, or nagging pain in your abdominal area.  You have lower back pain.  You have persistent nausea, vomiting, or  diarrhea.  You have an unusual or bad smelling vaginal discharge.  You have pain when you urinate. Get help right away if:  Your water breaks before 37 weeks.  You have regular contractions less than 5 minutes apart before 37 weeks.  You have a fever.  You are leaking fluid from your vagina.  You have spotting or bleeding from your vagina.  You have severe abdominal pain or cramping.  You have rapid weight loss or weight gain.  You have   shortness of breath with chest pain.  You notice sudden or extreme swelling of your face, hands, ankles, feet, or legs.  Your baby makes fewer than 10 movements in 2 hours.  You have severe headaches that do not go away when you take medicine.  You have vision changes. Summary  The third trimester is from week 28 through week 40, months 7 through 9. The third trimester is a time when the unborn baby (fetus) is growing rapidly.  During the third trimester, your discomfort may increase as you and your baby continue to gain weight. You may have abdominal, leg, and back pain, sleeping problems, and an increased need to urinate.  During the third trimester your breasts will keep growing and they will continue to become tender. A yellow fluid (colostrum) may leak from your breasts. This is the first milk you are producing for your baby.  False labor is a condition in which you feel small, irregular tightenings of the muscles in the womb (contractions) that eventually go away. These are called Braxton Hicks contractions. Contractions may last for hours, days, or even weeks before true labor sets in.  Signs of labor can include: abdominal cramps; regular contractions that start at 10 minutes apart and become stronger and more frequent with time; watery or bloody mucus discharge that comes from the vagina; increased pelvic pressure and dull back pain; and leaking of amniotic fluid. This information is not intended to replace advice given to you by your  health care provider. Make sure you discuss any questions you have with your health care provider. Document Revised: 03/01/2019 Document Reviewed: 12/14/2016 Elsevier Patient Education  2020 [employer]  

## 2020-02-26 NOTE — Progress Notes (Signed)
U/s today. No vb. No lof.  

## 2020-02-26 NOTE — Addendum Note (Signed)
Addended by: Liliane Shi on: 02/26/2020 11:38 AM   Modules accepted: Orders

## 2020-02-26 NOTE — Progress Notes (Signed)
  Routine Prenatal Care Visit  Subjective  Angela Nunez is a 29 y.o. G1P0000 at [redacted]w[redacted]d being seen today for ongoing prenatal care.  She is currently monitored for the following issues for this low-risk pregnancy and has History of hypothyroidism; Situational anxiety; Supervision of normal first pregnancy, antepartum; Obesity affecting pregnancy, antepartum; and BMI 34.0-34.9,adult on their problem list.  -----------------------------------------------------------------------------------  Patient reports carpal tunnel in right hand.  She has taken childbirth and breastfeeding classes Contractions: Not present. Vag. Bleeding: None.  Movement: Present. Leaking Fluid denies.  ----------------------------------------------------------------------------------- The following portions of the patient's history were reviewed and updated as appropriate: allergies, current medications, past family history, past medical history, past social history, past surgical history and problem list. Problem list updated.  Objective  Blood pressure 126/84, weight 196 lb (88.9 kg), last menstrual period 07/19/2019. Pregravid weight 180 lb (81.6 kg) Total Weight Gain 16 lb (7.258 kg) Urinalysis: Urine Protein    Urine Glucose    Fetal Status: Fetal Heart Rate (bpm): 150 Fundal Height: 32 cm Movement: Present  Presentation: Vertex   Growth/AFI: 44.2%, AC 36.2%, 3 pounds 15 ounces, AFI 13.9 cm  General:  Alert, oriented and cooperative. Patient is in no acute distress.  Skin: Skin is warm and dry. No rash noted.   Cardiovascular: Normal heart rate noted  Respiratory: Normal respiratory effort, no problems with respiration noted  Abdomen: Soft, gravid, appropriate for gestational age. Pain/Pressure: Absent     Pelvic:  Cervical exam deferred        Extremities: Normal range of motion.     Mental Status: Normal mood and affect. Normal behavior. Normal judgment and thought content.   Assessment   29 y.o. G1P0000 at  [redacted]w[redacted]d by  04/24/2020, by Last Menstrual Period presenting for routine prenatal visit  Plan   pregnancy Problems (from 08/29/19 to present)    Problem Noted Resolved   Supervision of normal first pregnancy, antepartum 08/29/2019 by Tresea Mall, CNM No   Overview Addendum 02/12/2020  5:18 PM by Farrel Conners, CNM    Clinic [facility] Prenatal Labs  Dating LMP = 7 week [place] Blood type: A/Positive/-- (10/20 1122)   Genetic Screen NIPS:Normal XY; MSAFP negative Antibody:Negative (10/20 1122)  Anatomic [place] Normal anatomy, female gender, anterior placenta Rubella: 1.74 (10/20 1122) Varicella:IMmune  GTT Early:  139             Third trimester: 122 RPR: Non Reactive (10/20 1122)   Rhogam NA HBsAg: Negative (10/20 1122)   Vaccines TDAP:                    Flu Shot: 08/29/19 HIV: Non Reactive (10/20 1122)   Baby Food  Breast                              GBS:   Contraception  Pap: 2019 neg  CBB     CS/VBAC  NA   Support Person Husband Hayden          Obesity affecting pregnancy, antepartum 08/29/2019 by Tresea Mall, CNM No       Preterm labor symptoms and general obstetric precautions including but not limited to vaginal bleeding, contractions, leaking of fluid and fetal movement were reviewed in detail with the patient. Please refer to After Visit Summary for other counseling recommendations.   Return in about 2 weeks (around 03/11/2020) for rob.  Tresea Mall, CNM 02/26/2020 11:04 AM

## 2020-03-11 ENCOUNTER — Ambulatory Visit (INDEPENDENT_AMBULATORY_CARE_PROVIDER_SITE_OTHER): Payer: BC Managed Care – PPO | Admitting: Obstetrics and Gynecology

## 2020-03-11 ENCOUNTER — Other Ambulatory Visit: Payer: Self-pay

## 2020-03-11 ENCOUNTER — Encounter: Payer: Self-pay | Admitting: Obstetrics and Gynecology

## 2020-03-11 VITALS — BP 122/70 | Wt 198.0 lb

## 2020-03-11 DIAGNOSIS — Z8639 Personal history of other endocrine, nutritional and metabolic disease: Secondary | ICD-10-CM

## 2020-03-11 DIAGNOSIS — Z6834 Body mass index (BMI) 34.0-34.9, adult: Secondary | ICD-10-CM

## 2020-03-11 DIAGNOSIS — O99213 Obesity complicating pregnancy, third trimester: Secondary | ICD-10-CM

## 2020-03-11 DIAGNOSIS — Z3403 Encounter for supervision of normal first pregnancy, third trimester: Secondary | ICD-10-CM

## 2020-03-11 DIAGNOSIS — Z3A33 33 weeks gestation of pregnancy: Secondary | ICD-10-CM

## 2020-03-11 NOTE — Progress Notes (Signed)
  Routine Prenatal Care Visit  Subjective  Angela Nunez is a 29 y.o. G1P0000 at [redacted]w[redacted]d being seen today for ongoing prenatal care.  She is currently monitored for the following issues for this low-risk pregnancy and has History of hypothyroidism; Situational anxiety; Supervision of normal first pregnancy, antepartum; Obesity affecting pregnancy, antepartum; and BMI 34.0-34.9,adult on their problem list.  ----------------------------------------------------------------------------------- Patient reports no complaints.   Contractions: Not present. Vag. Bleeding: None.  Movement: Present. Leaking Fluid denies.  ----------------------------------------------------------------------------------- The following portions of the patient's history were reviewed and updated as appropriate: allergies, current medications, past family history, past medical history, past social history, past surgical history and problem list. Problem list updated.  Objective  Blood pressure 122/70, weight 198 lb (89.8 kg), last menstrual period 07/19/2019. Pregravid weight 180 lb (81.6 kg) Total Weight Gain 18 lb (8.165 kg) Urinalysis: Urine Protein    Urine Glucose    Fetal Status: Fetal Heart Rate (bpm): 140 Fundal Height: 33 cm Movement: Present     General:  Alert, oriented and cooperative. Patient is in no acute distress.  Skin: Skin is warm and dry. No rash noted.   Cardiovascular: Normal heart rate noted  Respiratory: Normal respiratory effort, no problems with respiration noted  Abdomen: Soft, gravid, appropriate for gestational age. Pain/Pressure: Absent     Pelvic:  Cervical exam deferred        Extremities: Normal range of motion.     Mental Status: Normal mood and affect. Normal behavior. Normal judgment and thought content.   Assessment   29 y.o. G1P0000 at [redacted]w[redacted]d by  04/24/2020, by Last Menstrual Period presenting for routine prenatal visit  Plan   pregnancy Problems (from 08/29/19 to present)    Problem Noted Resolved   Supervision of normal first pregnancy, antepartum 08/29/2019 by Tresea Mall, CNM No   Overview Addendum 02/12/2020  5:18 PM by Farrel Conners, CNM    Clinic [facility] Prenatal Labs  Dating LMP = 7 week [place] Blood type: A/Positive/-- (10/20 1122)   Genetic Screen NIPS:Normal XY; MSAFP negative Antibody:Negative (10/20 1122)  Anatomic [place] Normal anatomy, female gender, anterior placenta Rubella: 1.74 (10/20 1122) Varicella:IMmune  GTT Early:  139             Third trimester: 122 RPR: Non Reactive (10/20 1122)   Rhogam NA HBsAg: Negative (10/20 1122)   Vaccines TDAP:                    Flu Shot: 08/29/19 HIV: Non Reactive (10/20 1122)   Baby Food  Breast                              GBS:   Contraception  Pap: 2019 neg  CBB     CS/VBAC  NA   Support Person Husband Hayden          Obesity affecting pregnancy, antepartum 08/29/2019 by Tresea Mall, CNM No       Preterm labor symptoms and general obstetric precautions including but not limited to vaginal bleeding, contractions, leaking of fluid and fetal movement were reviewed in detail with the patient. Please refer to After Visit Summary for other counseling recommendations.   Return in about 2 weeks (around 03/25/2020) for Routine Prenatal Appointment.  Thomasene Mohair, MD, [facility], [facility] 03/11/2020 10:18 AM

## 2020-03-25 ENCOUNTER — Other Ambulatory Visit (HOSPITAL_COMMUNITY)
Admission: RE | Admit: 2020-03-25 | Discharge: 2020-03-25 | Disposition: A | Discharge status: Home / Self Care | Payer: BC Managed Care – PPO | Source: Ambulatory Visit | Attending: Obstetrics and Gynecology | Admitting: Obstetrics and Gynecology

## 2020-03-25 ENCOUNTER — Ambulatory Visit (INDEPENDENT_AMBULATORY_CARE_PROVIDER_SITE_OTHER): Payer: BC Managed Care – PPO | Admitting: Obstetrics and Gynecology

## 2020-03-25 ENCOUNTER — Other Ambulatory Visit: Payer: Self-pay

## 2020-03-25 ENCOUNTER — Encounter: Payer: Self-pay | Admitting: Obstetrics and Gynecology

## 2020-03-25 VITALS — BP 120/80 | Wt 199.0 lb

## 2020-03-25 DIAGNOSIS — Z3400 Encounter for supervision of normal first pregnancy, unspecified trimester: Secondary | ICD-10-CM

## 2020-03-25 DIAGNOSIS — Z3A35 35 weeks gestation of pregnancy: Secondary | ICD-10-CM | POA: Diagnosis present

## 2020-03-25 DIAGNOSIS — O99213 Obesity complicating pregnancy, third trimester: Secondary | ICD-10-CM

## 2020-03-25 NOTE — Patient Instructions (Signed)
Pain Relief During Labor and Delivery Many things can cause pain during labor and delivery, including:  Pressure on bones and ligaments due to the baby moving through the pelvis.  Stretching of tissues due to the baby moving through the birth canal.  Muscle tension due to anxiety or nervousness.  The uterus tightening (contracting) and relaxing to help move the baby. There are many ways to deal with the pain of labor and delivery. They include:  Taking prenatal classes. Taking these classes helps you know what to expect during your baby's birth. What you learn will increase your confidence and decrease your anxiety.  Practicing relaxation techniques or doing relaxing activities, such as: ? Focused breathing. ? Meditation. ? Visualization. ? Aroma therapy. ? Listening to your favorite music. ? Hypnosis.  Taking a warm shower or bath (hydrotherapy). This may: ? Provide comfort and relaxation. ? Lessen your perception of pain. ? Decrease the amount of pain medicine needed. ? Decrease the length of labor.  Getting a massage or counterpressure on your back.  Applying warm packs or ice packs.  Changing positions often, moving around, or using a birthing ball.  Getting: ? Pain medicine through an IV or injection into a muscle. ? Pain medicine inserted into your spinal column. ? Injections of sterile water just under the skin on your lower back (intradermal injections). ? Laughing gas (nitrous oxide). Discuss your pain control options with your health care provider during your prenatal visits. Explore the options offered by your hospital or birth center. What kinds of medicine are available? There are two kinds of medicines that can be used to relieve pain during labor and delivery:  Analgesics. These medicines decrease pain without causing you to lose feeling or the ability to move your muscles.  Anesthetics. These medicines block feeling in the body and can decrease your  ability to move freely. Both of these kinds of medicine can cause minor side effects, such as nausea, trouble concentrating, and sleepiness. They can also decrease the baby's heart rate before birth and affect the baby's breathing rate after birth. For this reason, health care providers are careful about when and how much medicine is given. What are specific medicines and procedures that provide pain relief? Local Anesthetics Local anesthetics are used to numb a small area of the body. They may be used along with another kind of anesthetic or used to numb the nerves of the vagina, cervix, and perineum during the second stage of labor. General Anesthetics General anesthetics cause you to lose consciousness so you do not feel pain. They are usually only used for an emergency cesarean delivery. General anesthetics are given through an IV tube and a mask. Pudendal Block A pudendal block is a form of local anesthetic. It may be used to relieve the pain associated with pushing or stretching of the perineum at the time of delivery or to further numb the perineum. A pudendal block is done by injecting numbing medicine through the vaginal wall into a nerve in the pelvis. Epidural Analgesia Epidural analgesia is given through a flexible IV catheter that is inserted into the lower back. Numbing medicine is delivered continuously to the area near your spinal column nerves (epidural space). After having this type of analgesia, you may be able to move your legs but you most likely will not be able to walk. Depending on the amount of medicine given, you may lose all feeling in the lower half of your body, or you may retain some level   of sensation, including the urge to push. Epidural analgesia can be used to provide pain relief for a vaginal birth. Spinal Block A spinal block is similar to epidural analgesia, but the medicine is injected into the spinal fluid instead of the epidural space. A spinal block is only given  once. It starts to relieve pain quickly, but the pain relief lasts only 1-6 hours. Spinal blocks can be used for cesarean deliveries. Combined Spinal-Epidural (CSE) Block A CSE block combines the effects of a spinal block and epidural analgesia. The spinal block works quickly to block all pain. The epidural analgesia provides continuous pain relief, even after the effects of the spinal block have worn off. This information is not intended to replace advice given to you by your health care provider. Make sure you discuss any questions you have with your health care provider. Document Revised: 10/21/2017 Document Reviewed: 03/31/2016 Elsevier Patient Education  2020 [employer] Vaginal Delivery  Vaginal delivery means that you give birth by pushing your baby out of your birth canal (vagina). A team of health care providers will help you before, during, and after vaginal delivery. Birth experiences are unique for every woman and every pregnancy, and birth experiences vary depending on where you choose to give birth. What happens when I arrive at the birth center or hospital? Once you are in labor and have been admitted into the hospital or birth center, your health care provider may:  Review your pregnancy history and any concerns that you have.  Insert an IV into one of your veins. This may be used to give you fluids and medicines.  Check your blood pressure, pulse, temperature, and heart rate (vital signs).  Check whether your bag of water (amniotic sac) has broken (ruptured).  Talk with you about your birth plan and discuss pain control options. Monitoring Your health care provider may monitor your contractions (uterine monitoring) and your baby's heart rate (fetal monitoring). You may need to be monitored:  Often, but not continuously (intermittently).  All the time or for long periods at a time (continuously). Continuous monitoring may be needed if: ? You are taking certain medicines,  such as medicine to relieve pain or make your contractions stronger. ? You have pregnancy or labor complications. Monitoring may be done by:  Placing a special stethoscope or a handheld monitoring device on your abdomen to check your baby's heartbeat and to check for contractions.  Placing monitors on your abdomen (external monitors) to record your baby's heartbeat and the frequency and length of contractions.  Placing monitors inside your uterus through your vagina (internal monitors) to record your baby's heartbeat and the frequency, length, and strength of your contractions. Depending on the type of monitor, it may remain in your uterus or on your baby's head until birth.  Telemetry. This is a type of continuous monitoring that can be done with external or internal monitors. Instead of having to stay in bed, you are able to move around during telemetry. Physical exam Your health care provider may perform frequent physical exams. This may include:  Checking how and where your baby is positioned in your uterus.  Checking your cervix to determine: ? Whether it is thinning out (effacing). ? Whether it is opening up (dilating). What happens during labor and delivery?  Normal labor and delivery is divided into the following three stages: Stage 1  This is the longest stage of labor.  This stage can last for hours or days.  Throughout this stage,   you will feel contractions. Contractions generally feel mild, infrequent, and irregular at first. They get stronger, more frequent (about every 2-3 minutes), and more regular as you move through this stage.  This stage ends when your cervix is completely dilated to 4 inches (10 cm) and completely effaced. Stage 2  This stage starts once your cervix is completely effaced and dilated and lasts until the delivery of your baby.  This stage may last from 20 minutes to 2 hours.  This is the stage where you will feel an urge to push your baby out of  your vagina.  You may feel stretching and burning pain, especially when the widest part of your baby's head passes through the vaginal opening (crowning).  Once your baby is delivered, the umbilical cord will be clamped and cut. This usually occurs after waiting a period of 1-2 minutes after delivery.  Your baby will be placed on your bare chest (skin-to-skin contact) in an upright position and covered with a warm blanket. Watch your baby for feeding cues, like rooting or sucking, and help the baby to your breast for his or her first feeding. Stage 3  This stage starts immediately after the birth of your baby and ends after you deliver the placenta.  This stage may take anywhere from 5 to 30 minutes.  After your baby has been delivered, you will feel contractions as your body expels the placenta and your uterus contracts to control bleeding. What can I expect after labor and delivery?  After labor is over, you and your baby will be monitored closely until you are ready to go home to ensure that you are both healthy. Your health care team will teach you how to care for yourself and your baby.  You and your baby will stay in the same room (rooming in) during your hospital stay. This will encourage early bonding and successful breastfeeding.  You may continue to receive fluids and medicines through an IV.  Your uterus will be checked and massaged regularly (fundal massage).  You will have some soreness and pain in your abdomen, vagina, and the area of skin between your vaginal opening and your anus (perineum).  If an incision was made near your vagina (episiotomy) or if you had some vaginal tearing during delivery, cold compresses may be placed on your episiotomy or your tear. This helps to reduce pain and swelling.  You may be given a squirt bottle to use instead of wiping when you go to the bathroom. To use the squirt bottle, follow these steps: ? Before you urinate, fill the squirt  bottle with warm water. Do not use hot water. ? After you urinate, while you are sitting on the toilet, use the squirt bottle to rinse the area around your urethra and vaginal opening. This rinses away any urine and blood. ? Fill the squirt bottle with clean water every time you use the bathroom.  It is normal to have vaginal bleeding after delivery. Wear a sanitary pad for vaginal bleeding and discharge. Summary  Vaginal delivery means that you will give birth by pushing your baby out of your birth canal (vagina).  Your health care provider may monitor your contractions (uterine monitoring) and your baby's heart rate (fetal monitoring).  Your health care provider may perform a physical exam.  Normal labor and delivery is divided into three stages.  After labor is over, you and your baby will be monitored closely until you are ready to go home. This information   is not intended to replace advice given to you by your health care provider. Make sure you discuss any questions you have with your health care provider. Document Revised: 12/13/2017 Document Reviewed: 12/13/2017 Elsevier Patient Education  2020 [employer] Inc[employer]  

## 2020-03-25 NOTE — Progress Notes (Signed)
    Routine Prenatal Care Visit  Subjective  Angela Nunez is a 29 y.o. G1P0000 at [redacted]w[redacted]d being seen today for ongoing prenatal care.  She is currently monitored for the following issues for this low-risk pregnancy and has History of hypothyroidism; Situational anxiety; Supervision of normal first pregnancy, antepartum; Obesity affecting pregnancy, antepartum; and BMI 34.0-34.9,adult on their problem list.  ----------------------------------------------------------------------------------- Patient reports no complaints.   Contractions: Not present. Vag. Bleeding: None.  Movement: Present. Denies leaking of fluid.  ----------------------------------------------------------------------------------- The following portions of the patient's history were reviewed and updated as appropriate: allergies, current medications, past family history, past medical history, past social history, past surgical history and problem list. Problem list updated.   Objective  Blood pressure 120/80, weight 199 lb (90.3 kg), last menstrual period 07/19/2019. Pregravid weight 180 lb (81.6 kg) Total Weight Gain 19 lb (8.618 kg) Urinalysis:      Fetal Status: Fetal Heart Rate (bpm): 145 Fundal Height: 35 cm Movement: Present  Presentation: Vertex  General:  Alert, oriented and cooperative. Patient is in no acute distress.  Skin: Skin is warm and dry. No rash noted.   Cardiovascular: Normal heart rate noted  Respiratory: Normal respiratory effort, no problems with respiration noted  Abdomen: Soft, gravid, appropriate for gestational age. Pain/Pressure: Absent     Pelvic:  Cervical exam performed Dilation: Closed Effacement (%): 0 Station: -3  Extremities: Normal range of motion.  Edema: None  Mental Status: Normal mood and affect. Normal behavior. Normal judgment and thought content.     Assessment   29 y.o. G1P0000 at [redacted]w[redacted]d by  04/24/2020, by Last Menstrual Period presenting for routine prenatal visit  Plan    pregnancy Problems (from 08/29/19 to present)    Problem Noted Resolved   Supervision of normal first pregnancy, antepartum 08/29/2019 by Tresea Mall, CNM No   Overview Addendum 03/25/2020  8:17 AM by Natale Milch, MD    [facility] Prenatal Labs  Dating LMP = 7 week [place] Blood type: A/Positive/-- (10/20 1122)   Genetic Screen NIPS:Normal XY; MSAFP negative Antibody:Negative (10/20 1122)  Anatomic [place] Normal anatomy, female gender, anterior placenta Rubella: 1.74 (10/20 1122) Varicella:IMmune  GTT Early:  139             Third trimester: 122 RPR: Non Reactive (10/20 1122)   Rhogam NA HBsAg: Negative (10/20 1122)   Vaccines TDAP: 02/26/2020                   Flu Shot: 08/29/19 HIV: Non Reactive (10/20 1122)   Baby Food  Breast                              GBS:   Contraception  Pap: 2019 neg  CBB     CS/VBAC  NA   Support Person Husband Hayden          Obesity affecting pregnancy, antepartum 08/29/2019 by Tresea Mall, CNM No      GBS and GC/CT today.   Gestational age appropriate obstetric precautions including but not limited to vaginal bleeding, contractions, leaking of fluid and fetal movement were reviewed in detail with the patient.    Return in about 1 week (around 04/01/2020) for ROB in person.  Natale Milch MD [facility], [facility] 03/25/2020, 8:27 AM

## 2020-03-26 LAB — CERVICOVAGINAL ANCILLARY ONLY
Chlamydia: NEGATIVE
Comment: NEGATIVE
Comment: NORMAL
Neisseria Gonorrhea: NEGATIVE

## 2020-03-29 LAB — CULTURE, BETA STREP (GROUP B ONLY): Strep Gp B Culture: NEGATIVE

## 2020-04-01 ENCOUNTER — Other Ambulatory Visit: Payer: Self-pay

## 2020-04-01 ENCOUNTER — Ambulatory Visit (INDEPENDENT_AMBULATORY_CARE_PROVIDER_SITE_OTHER): Payer: BC Managed Care – PPO | Admitting: Obstetrics

## 2020-04-01 VITALS — BP 118/76 | Wt 203.0 lb

## 2020-04-01 DIAGNOSIS — Z3A36 36 weeks gestation of pregnancy: Secondary | ICD-10-CM

## 2020-04-01 DIAGNOSIS — O0993 Supervision of high risk pregnancy, unspecified, third trimester: Secondary | ICD-10-CM

## 2020-04-01 NOTE — Progress Notes (Signed)
No vb. No lof.  

## 2020-04-01 NOTE — Patient Instructions (Signed)

## 2020-04-01 NOTE — Progress Notes (Signed)
  Routine Prenatal Care Visit  Subjective  Angela Nunez is a 29 y.o. G1P0000 at [redacted]w[redacted]d being seen today for ongoing prenatal care.  She is currently monitored for the following issues for this low-risk pregnancy and has History of hypothyroidism; Situational anxiety; Supervision of normal first pregnancy, antepartum; Obesity affecting pregnancy, antepartum; and BMI 34.0-34.9,adult on their problem list.  ----------------------------------------------------------------------------------- Patient report soccasional backache.   Contractions: Not present. Vag. Bleeding: None.  Movement: Present. Leaking Fluid denies.  ----------------------------------------------------------------------------------- The following portions of the patient's history were reviewed and updated as appropriate: allergies, current medications, past family history, past medical history, past social history, past surgical history and problem list. Problem list updated.  Objective  Blood pressure 118/76, weight 203 lb (92.1 kg), last menstrual period 07/19/2019. Pregravid weight 180 lb (81.6 kg) Total Weight Gain 23 lb (10.4 kg) Urinalysis: Urine Protein    Urine Glucose    Fetal Status:     Movement: Present     General:  Alert, oriented and cooperative. Patient is in no acute distress.  Skin: Skin is warm and dry. No rash noted.   Cardiovascular: Normal heart rate noted  Respiratory: Normal respiratory effort, no problems with respiration noted  Abdomen: Soft, gravid, appropriate for gestational age. Pain/Pressure: Absent     Pelvic:  Cervical exam deferred        Extremities: Normal range of motion.     Mental Status: Normal mood and affect. Normal behavior. Normal judgment and thought content.   Assessment   29 y.o. G1P0000 at [redacted]w[redacted]d by  04/24/2020, by Last Menstrual Period presenting for routine prenatal visit  Plan   pregnancy Problems (from 08/29/19 to present)    Problem Noted Resolved   Supervision of  normal first pregnancy, antepartum 08/29/2019 by Tresea Mall, CNM No   Overview Addendum 03/25/2020  8:17 AM by Natale Milch, MD    [facility]inic [facility] Prenatal Labs  Dating LMP = 7 week [place] Blood type: A/Positive/-- (10/20 1122)   Genetic Screen NIPS:Normal XY; MSAFP negative Antibody:Negative (10/20 1122)  Anatomic [place] Normal anatomy, female gender, anterior placenta Rubella: 1.74 (10/20 1122) Varicella:IMmune  GTT Early:  139             Third trimester: 122 RPR: Non Reactive (10/20 1122)   Rhogam NA HBsAg: Negative (10/20 1122)   Vaccines TDAP: 02/26/2020                   Flu Shot: 08/29/19 HIV: Non Reactive (10/20 1122)   Baby Food  Breast                              GBS:   Contraception  Pap: 2019 neg  CBB     CS/VBAC  NA   Support Person Husband Hayden          Obesity affecting pregnancy, antepartum 08/29/2019 by Tresea Mall, CNM No       Term labor symptoms and general obstetric precautions including but not limited to vaginal bleeding, contractions, leaking of fluid and fetal movement were reviewed in detail with the patient. Please refer to After Visit Summary for other counseling recommendations.  Discussed the benefits of breastfeeding and encouraged her to view videos on nursing in preparation for birth. GBS negative.  RTC 1 weeks  Mirna Mires, CNM  04/01/2020 5:26 PM   Mirna Mires, CNM  04/01/2020 5:24 PM

## 2020-04-08 ENCOUNTER — Other Ambulatory Visit: Payer: Self-pay

## 2020-04-08 ENCOUNTER — Ambulatory Visit (INDEPENDENT_AMBULATORY_CARE_PROVIDER_SITE_OTHER): Payer: BC Managed Care – PPO | Admitting: Obstetrics

## 2020-04-08 VITALS — BP 132/78 | Wt 202.0 lb

## 2020-04-08 DIAGNOSIS — Z3403 Encounter for supervision of normal first pregnancy, third trimester: Secondary | ICD-10-CM

## 2020-04-08 DIAGNOSIS — Z3A37 37 weeks gestation of pregnancy: Secondary | ICD-10-CM

## 2020-04-08 LAB — POCT URINALYSIS DIPSTICK OB

## 2020-04-08 NOTE — Progress Notes (Signed)
Routine Prenatal Care Visit  Subjective  Angela Nunez is a 29 y.o. G1P0000 at [redacted]w[redacted]d being seen today for ongoing prenatal care.  She is currently monitored for the following issues for this low-risk pregnancy and has History of hypothyroidism; Situational anxiety; Supervision of normal first pregnancy, antepartum; Obesity affecting pregnancy, antepartum; and BMI 34.0-34.9,adult on their problem list.  ----------------------------------------------------------------------------------- Patient reports she has noticed some slight ankle edema. She has a hx of headaches, and had one over the weekend that responded to rest. Her baby is moving well, and she denies any danger sxs..   Contractions: Not present. Vag. Bleeding: None.  Movement: Present. Leaking Fluid denies.  ----------------------------------------------------------------------------------- The following portions of the patient's history were reviewed and updated as appropriate: allergies, current medications, past family history, past medical history, past social history, past surgical history and problem list. Problem list updated.  Objective  Blood pressure 132/78, weight 202 lb (91.6 kg), last menstrual period 07/19/2019. Pregravid weight 180 lb (81.6 kg) Total Weight Gain 22 lb (9.979 kg) Urinalysis: Urine Protein Trace  Urine Glucose (!) Small (1+)  Fetal Status:     Movement: Present     General:  Alert, oriented and cooperative. Patient is in no acute distress.  Skin: Skin is warm and dry. No rash noted.   Cardiovascular: Normal heart rate noted  Respiratory: Normal respiratory effort, no problems with respiration noted  Abdomen: Soft, gravid, appropriate for gestational age. Pain/Pressure: Absent     Pelvic:  Cervical exam performed        Closed/long/ballotable, softening, posterior  Extremities: Normal range of motion.     Mental Status: Normal mood and affect. Normal behavior. Normal judgment and thought content.    Assessment   29 y.o. G1P0000 at [redacted]w[redacted]d by  04/24/2020, by Last Menstrual Period presenting for routine prenatal visit  Plan   pregnancy Problems (from 08/29/19 to present)    Problem Noted Resolved   Supervision of normal first pregnancy, antepartum 08/29/2019 by Rod Can, CNM No   Overview Addendum 03/25/2020  8:17 AM by Homero Fellers, [place]  Dating LMP = 7 week [place] Blood type: A/Positive/-- (10/20 1122)   Genetic Screen NIPS:Normal XY; MSAFP negative Antibody:Negative (10/20 1122)  Anatomic [place] Normal anatomy, female gender, anterior placenta Rubella: 1.74 (10/20 1122) Varicella:IMmune  GTT Early:  139             Third trimester: 122 RPR: Non Reactive (10/20 1122)   Rhogam NA HBsAg: Negative (10/20 1122)   Vaccines TDAP: 02/26/2020                   Flu Shot: 08/29/19 HIV: Non Reactive (10/20 1122)   Baby Food  Breast                              GBS:   Contraception  Pap: 2019 neg  CBB     CS/VBAC  NA   Support Person Husband Hayden          Obesity affecting pregnancy, antepartum 08/29/2019 by Rod Can, CNM No       Term labor symptoms and general obstetric precautions including but not limited to vaginal bleeding, contractions, leaking of fluid and fetal movement were reviewed in detail with the patient. Please refer to After Visit Summary for other counseling recommendations.   Return in about 1 week (around 04/15/2020) for return OB.  Joycelyn Schmid  Liana Crocker, CNM  04/08/2020 4:12 PM

## 2020-04-08 NOTE — Progress Notes (Signed)
No complaints. Desires cervix check today. 

## 2020-04-08 NOTE — Patient Instructions (Signed)
Vaginal Delivery  Vaginal delivery means that you give birth by pushing your baby out of your birth canal (vagina). A team of health care providers will help you before, during, and after vaginal delivery. Birth experiences are unique for every woman and every pregnancy, and birth experiences vary depending on where you choose to give birth. What happens when I arrive at the birth center or hospital? Once you are in labor and have been admitted into the hospital or birth center, your health care provider may:  Review your pregnancy history and any concerns that you have.  Insert an IV into one of your veins. This may be used to give you fluids and medicines.  Check your blood pressure, pulse, temperature, and heart rate (vital signs).  Check whether your bag of water (amniotic sac) has broken (ruptured).  Talk with you about your birth plan and discuss pain control options. Monitoring Your health care provider may monitor your contractions (uterine monitoring) and your baby's heart rate (fetal monitoring). You may need to be monitored:  Often, but not continuously (intermittently).  All the time or for long periods at a time (continuously). Continuous monitoring may be needed if: ? You are taking certain medicines, such as medicine to relieve pain or make your contractions stronger. ? You have pregnancy or labor complications. Monitoring may be done by:  Placing a special stethoscope or a handheld monitoring device on your abdomen to check your baby's heartbeat and to check for contractions.  Placing monitors on your abdomen (external monitors) to record your baby's heartbeat and the frequency and length of contractions.  Placing monitors inside your uterus through your vagina (internal monitors) to record your baby's heartbeat and the frequency, length, and strength of your contractions. Depending on the type of monitor, it may remain in your uterus or on your baby's head until  birth.  Telemetry. This is a type of continuous monitoring that can be done with external or internal monitors. Instead of having to stay in bed, you are able to move around during telemetry. Physical exam Your health care provider may perform frequent physical exams. This may include:  Checking how and where your baby is positioned in your uterus.  Checking your cervix to determine: ? Whether it is thinning out (effacing). ? Whether it is opening up (dilating). What happens during labor and delivery?  Normal labor and delivery is divided into the following three stages: Stage 1  This is the longest stage of labor.  This stage can last for hours or days.  Throughout this stage, you will feel contractions. Contractions generally feel mild, infrequent, and irregular at first. They get stronger, more frequent (about every 2-3 minutes), and more regular as you move through this stage.  This stage ends when your cervix is completely dilated to 4 inches (10 cm) and completely effaced. Stage 2  This stage starts once your cervix is completely effaced and dilated and lasts until the delivery of your baby.  This stage may last from 20 minutes to 2 hours.  This is the stage where you will feel an urge to push your baby out of your vagina.  You may feel stretching and burning pain, especially when the widest part of your baby's head passes through the vaginal opening (crowning).  Once your baby is delivered, the umbilical cord will be clamped and cut. This usually occurs after waiting a period of 1-2 minutes after delivery.  Your baby will be placed on your bare chest (  skin-to-skin contact) in an upright position and covered with a warm blanket. Watch your baby for feeding cues, like rooting or sucking, and help the baby to your breast for his or her first feeding. Stage 3  This stage starts immediately after the birth of your baby and ends after you deliver the placenta.  This stage may  take anywhere from 5 to 30 minutes.  After your baby has been delivered, you will feel contractions as your body expels the placenta and your uterus contracts to control bleeding. What can I expect after labor and delivery?  After labor is over, you and your baby will be monitored closely until you are ready to go home to ensure that you are both healthy. Your health care team will teach you how to care for yourself and your baby.  You and your baby will stay in the same room (rooming in) during your hospital stay. This will encourage early bonding and successful breastfeeding.  You may continue to receive fluids and medicines through an IV.  Your uterus will be checked and massaged regularly (fundal massage).  You will have some soreness and pain in your abdomen, vagina, and the area of skin between your vaginal opening and your anus (perineum).  If an incision was made near your vagina (episiotomy) or if you had some vaginal tearing during delivery, cold compresses may be placed on your episiotomy or your tear. This helps to reduce pain and swelling.  You may be given a squirt bottle to use instead of wiping when you go to the bathroom. To use the squirt bottle, follow these steps: ? Before you urinate, fill the squirt bottle with warm water. Do not use hot water. ? After you urinate, while you are sitting on the toilet, use the squirt bottle to rinse the area around your urethra and vaginal opening. This rinses away any urine and blood. ? Fill the squirt bottle with clean water every time you use the bathroom.  It is normal to have vaginal bleeding after delivery. Wear a sanitary pad for vaginal bleeding and discharge. Summary  Vaginal delivery means that you will give birth by pushing your baby out of your birth canal (vagina).  Your health care provider may monitor your contractions (uterine monitoring) and your baby's heart rate (fetal monitoring).  Your health care provider may  perform a physical exam.  Normal labor and delivery is divided into three stages.  After labor is over, you and your baby will be monitored closely until you are ready to go home. This information is not intended to replace advice given to you by your health care provider. Make sure you discuss any questions you have with your health care provider. Document Revised: 12/13/2017 Document Reviewed: 12/13/2017 Elsevier Patient Education  2020 [employer]  Pain Relief During Labor and Delivery Many things can cause pain during labor and delivery, including:  Pressure on bones and ligaments due to the baby moving through the pelvis.  Stretching of tissues due to the baby moving through the birth canal.  Muscle tension due to anxiety or nervousness.  The uterus tightening (contracting) and relaxing to help move the baby. There are many ways to deal with the pain of labor and delivery. They include:  Taking prenatal classes. Taking these classes helps you know what to expect during your baby's birth. What you learn will increase your confidence and decrease your anxiety.  Practicing relaxation techniques or doing relaxing activities, such as: ? Focused breathing. ?   Meditation. ? Visualization. ? Aroma therapy. ? Listening to your favorite music. ? Hypnosis.  Taking a warm shower or bath (hydrotherapy). This may: ? Provide comfort and relaxation. ? Lessen your perception of pain. ? Decrease the amount of pain medicine needed. ? Decrease the length of labor.  Getting a massage or counterpressure on your back.  Applying warm packs or ice packs.  Changing positions often, moving around, or using a birthing ball.  Getting: ? Pain medicine through an IV or injection into a muscle. ? Pain medicine inserted into your spinal column. ? Injections of sterile water just under the skin on your lower back (intradermal injections). ? Laughing gas (nitrous oxide). Discuss your pain control  options with your health care provider during your prenatal visits. Explore the options offered by your hospital or birth center. What kinds of medicine are available? There are two kinds of medicines that can be used to relieve pain during labor and delivery:  Analgesics. These medicines decrease pain without causing you to lose feeling or the ability to move your muscles.  Anesthetics. These medicines block feeling in the body and can decrease your ability to move freely. Both of these kinds of medicine can cause minor side effects, such as nausea, trouble concentrating, and sleepiness. They can also decrease the baby's heart rate before birth and affect the baby's breathing rate after birth. For this reason, health care providers are careful about when and how much medicine is given. What are specific medicines and procedures that provide pain relief? Local Anesthetics Local anesthetics are used to numb a small area of the body. They may be used along with another kind of anesthetic or used to numb the nerves of the vagina, cervix, and perineum during the second stage of labor. General Anesthetics General anesthetics cause you to lose consciousness so you do not feel pain. They are usually only used for an emergency cesarean delivery. General anesthetics are given through an IV tube and a mask. Pudendal Block A pudendal block is a form of local anesthetic. It may be used to relieve the pain associated with pushing or stretching of the perineum at the time of delivery or to further numb the perineum. A pudendal block is done by injecting numbing medicine through the vaginal wall into a nerve in the pelvis. Epidural Analgesia Epidural analgesia is given through a flexible IV catheter that is inserted into the lower back. Numbing medicine is delivered continuously to the area near your spinal column nerves (epidural space). After having this type of analgesia, you may be able to move your legs but  you most likely will not be able to walk. Depending on the amount of medicine given, you may lose all feeling in the lower half of your body, or you may retain some level of sensation, including the urge to push. Epidural analgesia can be used to provide pain relief for a vaginal birth. Spinal Block A spinal block is similar to epidural analgesia, but the medicine is injected into the spinal fluid instead of the epidural space. A spinal block is only given once. It starts to relieve pain quickly, but the pain relief lasts only 1-6 hours. Spinal blocks can be used for cesarean deliveries. Combined Spinal-Epidural (CSE) Block A CSE block combines the effects of a spinal block and epidural analgesia. The spinal block works quickly to block all pain. The epidural analgesia provides continuous pain relief, even after the effects of the spinal block have worn off. This information   is not intended to replace advice given to you by your health care provider. Make sure you discuss any questions you have with your health care provider. Document Revised: 10/21/2017 Document Reviewed: 03/31/2016 Elsevier Patient Education  2020 Elsevier Inc.  

## 2020-04-16 ENCOUNTER — Other Ambulatory Visit: Payer: Self-pay

## 2020-04-16 ENCOUNTER — Ambulatory Visit (INDEPENDENT_AMBULATORY_CARE_PROVIDER_SITE_OTHER): Payer: BC Managed Care – PPO | Admitting: Obstetrics

## 2020-04-16 VITALS — BP 122/78 | Wt 201.0 lb

## 2020-04-16 DIAGNOSIS — Z3403 Encounter for supervision of normal first pregnancy, third trimester: Secondary | ICD-10-CM

## 2020-04-16 DIAGNOSIS — Z3A38 38 weeks gestation of pregnancy: Secondary | ICD-10-CM

## 2020-04-16 NOTE — Progress Notes (Signed)
Routine Prenatal Care Visit  Subjective  Angela Nunez is a 29 y.o. G1P0000 at [redacted]w[redacted]d being seen today for ongoing prenatal care.  She is currently monitored for the following issues for this  pregnancy and has History of hypothyroidism; Situational anxiety; Supervision of normal first pregnancy, antepartum; Obesity affecting pregnancy, antepartum; and BMI 34.0-34.9,adult on their problem list.  ----------------------------------------------------------------------------------- Patient reports no complaints.  She is having braxton Hicks UCs. She denies any danger sx, .requests a vag exam today, and shares a preference not to get to 42 weeks before laboring.  Contractions: Not present. Vag. Bleeding: None.  Movement: Present. Leaking Fluid denies.  ----------------------------------------------------------------------------------- The following portions of the patient's history were reviewed and updated as appropriate: allergies, current medications, past family history, past medical history, past social history, past surgical history and problem list. Problem list updated.  Objective  Blood pressure 122/78, weight 201 lb (91.2 kg), last menstrual period 07/19/2019. Pregravid weight 180 lb (81.6 kg) Total Weight Gain 21 lb (9.526 kg) Urinalysis: Urine Protein    Urine Glucose    Fetal Status:     Movement: Present     General:  Alert, oriented and cooperative. Patient is in no acute distress.  Skin: Skin is warm and dry. No rash noted.   Cardiovascular: Normal heart rate noted  Respiratory: Normal respiratory effort, no problems with respiration noted  Abdomen: Soft, gravid, appropriate for gestational age. Pain/Pressure: Absent     Pelvic:  Cervical exam performed        Cervix is posterior, softening, a finger tip/405 effaced/ballotable.  Extremities: Normal range of motion.     Mental Status: Normal mood and affect. Normal behavior. Normal judgment and thought content.   Assessment    29 y.o. G1P0000 at [redacted]w[redacted]d by  04/24/2020, by Last Menstrual Period presenting for routine prenatal visit  Plan   pregnancy Problems (from 08/29/19 to present)    Problem Noted Resolved   Supervision of normal first pregnancy, antepartum 08/29/2019 by Tresea Mall, CNM No   Overview Addendum 03/25/2020  8:17 AM by Natale Milch, MD    [facility]inic [facility] Prenatal Labs  Dating LMP = 7 week [place] Blood type: A/Positive/-- (10/20 1122)   Genetic Screen NIPS:Normal XY; MSAFP negative Antibody:Negative (10/20 1122)  Anatomic [place] Normal anatomy, female gender, anterior placenta Rubella: 1.74 (10/20 1122) Varicella:IMmune  GTT Early:  139             Third trimester: 122 RPR: Non Reactive (10/20 1122)   Rhogam NA HBsAg: Negative (10/20 1122)   Vaccines TDAP: 02/26/2020                   Flu Shot: 08/29/19 HIV: Non Reactive (10/20 1122)   Baby Food  Breast                              GBS:   Contraception  Pap: 2019 neg  CBB     CS/VBAC  NA   Support Person Husband Hayden          Obesity affecting pregnancy, antepartum 08/29/2019 by Tresea Mall, CNM No       Term labor symptoms and general obstetric precautions including but not limited to vaginal bleeding, contractions, leaking of fluid and fetal movement were reviewed in detail with the patient. Please refer to After Visit Summary for other counseling recommendations.   Return in about 1 week (around 04/23/2020) for return OB. We  can discuss IOL at 41 weeks or the option of BPP and NST at that point if she has not labored. SXS of latent and active labor reviewed carefully with her today.  Imagene Riches, CNM  04/16/2020 9:06 AM

## 2020-04-22 ENCOUNTER — Other Ambulatory Visit: Payer: Self-pay

## 2020-04-22 ENCOUNTER — Encounter: Payer: Self-pay | Admitting: Advanced Practice Midwife

## 2020-04-22 ENCOUNTER — Ambulatory Visit (INDEPENDENT_AMBULATORY_CARE_PROVIDER_SITE_OTHER): Payer: BC Managed Care – PPO | Admitting: Advanced Practice Midwife

## 2020-04-22 VITALS — BP 120/74 | Wt 201.0 lb

## 2020-04-22 DIAGNOSIS — Z3A39 39 weeks gestation of pregnancy: Secondary | ICD-10-CM

## 2020-04-22 DIAGNOSIS — Z3403 Encounter for supervision of normal first pregnancy, third trimester: Secondary | ICD-10-CM

## 2020-04-22 NOTE — Patient Instructions (Signed)

## 2020-04-22 NOTE — Progress Notes (Signed)
  Routine Prenatal Care Visit  Subjective  Angela Nunez is a 29 y.o. G1P0000 at [redacted]w[redacted]d being seen today for ongoing prenatal care.  She is currently monitored for the following issues for this low-risk pregnancy and has History of hypothyroidism; Situational anxiety; Supervision of normal first pregnancy, antepartum; Obesity affecting pregnancy, antepartum; and BMI 34.0-34.9,adult on their problem list.  ----------------------------------------------------------------------------------- Patient reports no complaints.  Discussed delivery timing and induction process. Patient prefers induction before 41 weeks. Contractions: Not present. Vag. Bleeding: None.  Movement: Present. Leaking Fluid denies.  ----------------------------------------------------------------------------------- The following portions of the patient's history were reviewed and updated as appropriate: allergies, current medications, past family history, past medical history, past social history, past surgical history and problem list. Problem list updated.  Objective  Blood pressure 120/74, weight 201 lb (91.2 kg), last menstrual period 07/19/2019. Pregravid weight 180 lb (81.6 kg) Total Weight Gain 21 lb (9.526 kg) Urinalysis: Urine Protein    Urine Glucose    Fetal Status: Fetal Heart Rate (bpm): 162 Fundal Height: 39 cm Movement: Present  Presentation: Vertex  General:  Alert, oriented and cooperative. Patient is in no acute distress.  Skin: Skin is warm and dry. No rash noted.   Cardiovascular: Normal heart rate noted  Respiratory: Normal respiratory effort, no problems with respiration noted  Abdomen: Soft, gravid, appropriate for gestational age. Pain/Pressure: Absent     Pelvic:  Cervical exam performed Dilation: 1 Effacement (%): 40, 50 Station: -2  Extremities: Normal range of motion.  Edema: None  Mental Status: Normal mood and affect. Normal behavior. Normal judgment and thought content.   Assessment   29  y.o. G1P0000 at [redacted]w[redacted]d by  04/24/2020, by Last Menstrual Period presenting for routine prenatal visit  Plan   pregnancy Problems (from 08/29/19 to present)    Problem Noted Resolved   Supervision of normal first pregnancy, antepartum 08/29/2019 by Tresea Mall, CNM No   Overview Addendum 03/25/2020  8:17 AM by Natale Milch, MD    [facility]inic [facility] Prenatal Labs  Dating LMP = 7 week [place] Blood type: A/Positive/-- (10/20 1122)   Genetic Screen NIPS:Normal XY; MSAFP negative Antibody:Negative (10/20 1122)  Anatomic [place] Normal anatomy, female gender, anterior placenta Rubella: 1.74 (10/20 1122) Varicella:IMmune  GTT Early:  139             Third trimester: 122 RPR: Non Reactive (10/20 1122)   Rhogam NA HBsAg: Negative (10/20 1122)   Vaccines TDAP: 02/26/2020                   Flu Shot: 08/29/19 HIV: Non Reactive (10/20 1122)   Baby Food  Breast                              GBS:   Contraception  Pap: 2019 neg  CBB     CS/VBAC  NA   Support Person Husband Hayden          Obesity affecting pregnancy, antepartum 08/29/2019 by Tresea Mall, CNM No    Covid Swab 6/3 IOL 6/5 at 5 am   Term labor symptoms and general obstetric precautions including but not limited to vaginal bleeding, contractions, leaking of fluid and fetal movement were reviewed in detail with the patient. Please refer to After Visit Summary for other counseling recommendations.   Return for IOL on Saturday.  Tresea Mall, CNM 04/22/2020 2:20 PM

## 2020-04-22 NOTE — H&P (Signed)
OB History & Physical    Date of initial H&P: 04/22/2020 History of Present Illness:  Chief Complaint: elective induction of labor  HPI:  Angela Nunez is a 29 y.o. G1P0000 female at [redacted]w[redacted]d dated by LMP.  Her pregnancy has been complicated by history of hypothyroidism, situational anxiety, obesity.    She denies contractions.   She denies leakage of fluid.   She denies vaginal bleeding.   She reports fetal movement.    Total weight gain for pregnancy: 21 lb (9.526 kg)   Obstetrical Problem List: pregnancy Problems (from 08/29/19 to present)    Problem Noted Resolved   Supervision of normal first pregnancy, antepartum 08/29/2019 by Tresea Mall, CNM No   Overview Addendum 03/25/2020  8:17 AM by Natale Milch, MD    [facility]inic [facility] Prenatal Labs  Dating LMP = 7 week [place] Blood type: A/Positive/-- (10/20 1122)   Genetic Screen NIPS:Normal XY; MSAFP negative Antibody:Negative (10/20 1122)  Anatomic [place] Normal anatomy, female gender, anterior placenta Rubella: 1.74 (10/20 1122) Varicella:IMmune  GTT Early:  139             Third trimester: 122 RPR: Non Reactive (10/20 1122)   Rhogam NA HBsAg: Negative (10/20 1122)   Vaccines TDAP: 02/26/2020                   Flu Shot: 08/29/19 HIV: Non Reactive (10/20 1122)   Baby Food  Breast                              GBS:   Contraception  Pap: 2019 neg  CBB     CS/VBAC  NA   Support Person Husband Hayden          Obesity affecting pregnancy, antepartum 08/29/2019 by Tresea Mall, CNM No       Maternal Medical History:   Past Medical History:  Diagnosis Date  . Hypothyroidism   . Vaccine for human papilloma virus (HPV) types 6, 11, 16, and 18 administered     Past Surgical History:  Procedure Laterality Date  . WISDOM TOOTH EXTRACTION     x4 extracted     No Known Allergies  Prior to Admission medications   Medication Sig Start Date End Date Taking? Authorizing Provider  augmented betamethasone dipropionate (DIPROLENE-AF)  0.05 % ointment  02/12/17   [provider]  hydrOXYzine (ATARAX/VISTARIL) 25 MG tablet Take 25 mg by mouth 3 (three) times daily as needed.    [provider]  Prenatal Vit-Fe Fumarate-FA (PRENATAL VITAMIN PO) Take by mouth.    [provider]    OB History  Gravida Para Term Preterm AB Living  1 0 0 0 0 0  SAB TAB Ectopic Multiple Live Births  0 0 0 0 0    # Outcome Date GA Lbr Len/2nd Weight Sex Delivery Anes PTL Lv  1 Current             Prenatal care site: [facility]  Social History: She  reports that she has never smoked. She has never used smokeless tobacco. She reports previous alcohol use. She reports that she does not use drugs.  Family History: family history includes Breast cancer in an other family member.    Review of Systems:  Review of Systems  Constitutional: Negative for chills and fever.  HENT: Negative for congestion, ear discharge, ear pain, hearing loss, sinus pain and sore throat.  Eyes: Negative for blurred vision and double vision.  Respiratory: Negative for cough, shortness of breath and wheezing.   Cardiovascular: Negative for chest pain, palpitations and leg swelling.  Gastrointestinal: Negative for abdominal pain, blood in stool, constipation, diarrhea, heartburn, melena, nausea and vomiting.  Genitourinary: Negative for dysuria, flank pain, frequency, hematuria and urgency.  Musculoskeletal: Negative for back pain, joint pain and myalgias.  Skin: Negative for itching and rash.  Neurological: Negative for dizziness, tingling, tremors, sensory change, speech change, focal weakness, seizures, loss of consciousness, weakness and headaches.  Endo/Heme/Allergies: Negative for environmental allergies. Does not bruise/bleed easily.  Psychiatric/Behavioral: Negative for depression, hallucinations, memory loss, substance abuse and suicidal ideas. The patient is not nervous/anxious and does not have insomnia.      Physical Exam:   BP 120/74   Wt 201 lb (91.2 kg)   LMP 07/19/2019 (Exact Date)   BMI 37.98 kg/m   Vital Signs: BP 120/74   Wt 201 lb (91.2 kg)   LMP 07/19/2019 (Exact Date)   BMI 37.98 kg/m  Constitutional: Well nourished, well developed female in no acute distress.  HEENT: normal Skin: Warm and dry.  Cardiovascular: Regular rate and rhythm.   Extremity: trace edema  Respiratory: Clear to auscultation bilateral. Normal respiratory effort Abdomen: FHT present Back: no CVAT Neuro: DTRs 2+, Cranial nerves grossly intact Psych: Alert and Oriented x3. No memory deficits. Normal mood and affect.  MS: normal gait, normal bilateral lower extremity ROM/strength/stability.  Pelvic exam:  is not limited by body habitus EGBUS: within normal limits Vagina: within normal limits and with normal mucosa  Cervix: 1/40-50/-2   Pertinent Results:   Blood type/Rh A positive  Antibody screen negative  Rubella Immune  Varicella Immune    RPR Non-reactive  HBsAg negative  HIV negative  GC negative  Chlamydia negative  Genetic screening Negative/female  1 hour GTT 122  3 hour GTT NA  GBS negative on 03/25/20     Lab Results  Component Value Date   [place] Not Detected 12/13/2019  ]  Pre-procedure covid test pending  Assessment:  Angela Nunez is a 29 y.o. G1P0000 female at [redacted]w[redacted]d with planned elective induction of labor  Plan:  1. Admit to Labor & Delivery  2. CBC, T&S, Clrs, IVF 3. GBS negative.   4. Fetal well-being: reassuring 5. Induction of labor risks/benefits discussed 6. Cervical exam on admission to determine method of induction    Rod Can, [facility] 04/22/2020 3:44 PM

## 2020-04-22 NOTE — Progress Notes (Signed)
No vb. No lof. Cervical check today  °

## 2020-04-25 LAB — OB RESULTS CONSOLE GC/CHLAMYDIA: Gonorrhea: NEGATIVE

## 2020-04-26 ENCOUNTER — Inpatient Hospital Stay
Admission: EM | Admit: 2020-04-26 | Discharge: 2020-04-28 | DRG: 807 | Disposition: A | Discharge status: Home / Self Care | Payer: BC Managed Care – PPO | Attending: Obstetrics | Admitting: Obstetrics

## 2020-04-26 ENCOUNTER — Other Ambulatory Visit: Payer: Self-pay

## 2020-04-26 ENCOUNTER — Encounter: Payer: Self-pay | Admitting: Advanced Practice Midwife

## 2020-04-26 DIAGNOSIS — O480 Post-term pregnancy: Secondary | ICD-10-CM | POA: Diagnosis present

## 2020-04-26 DIAGNOSIS — Z3400 Encounter for supervision of normal first pregnancy, unspecified trimester: Secondary | ICD-10-CM

## 2020-04-26 DIAGNOSIS — Z3A40 40 weeks gestation of pregnancy: Secondary | ICD-10-CM | POA: Diagnosis not present

## 2020-04-26 DIAGNOSIS — Z3490 Encounter for supervision of normal pregnancy, unspecified, unspecified trimester: Secondary | ICD-10-CM | POA: Diagnosis present

## 2020-04-26 DIAGNOSIS — O99210 Obesity complicating pregnancy, unspecified trimester: Secondary | ICD-10-CM

## 2020-04-26 LAB — PROTEIN / CREATININE RATIO, URINE
Creatinine, Urine: 39 mg/dL
Protein Creatinine Ratio: 0.21 mg/mg{creat} — ABNORMAL HIGH (ref 0.00–0.15)
Total Protein, Urine: 8 mg/dL

## 2020-04-26 LAB — COMPREHENSIVE METABOLIC PANEL
ALT: 11 U/L (ref 0–44)
AST: 20 U/L (ref 15–41)
Albumin: 3 g/dL — ABNORMAL LOW (ref 3.5–5.0)
Alkaline Phosphatase: 169 U/L — ABNORMAL HIGH (ref 38–126)
Anion gap: 10 (ref 5–15)
BUN: 8 mg/dL (ref 6–20)
CO2: 21 mmol/L — ABNORMAL LOW (ref 22–32)
Calcium: 9.6 mg/dL (ref 8.9–10.3)
Chloride: 105 mmol/L (ref 98–111)
Creatinine, Ser: 0.49 mg/dL (ref 0.44–1.00)
GFR calc Af Amer: >60 mL/min (ref >60)
GFR calc non Af Amer: >60 mL/min (ref >60)
Glucose, Bld: 106 mg/dL — ABNORMAL HIGH (ref 70–99)
Potassium: 3.4 mmol/L — ABNORMAL LOW (ref 3.5–5.1)
Sodium: 136 mmol/L (ref 135–145)
Total Bilirubin: 1 mg/dL (ref 0.3–1.2)
Total Protein: 6.5 g/dL (ref 6.5–8.1)

## 2020-04-26 LAB — CBC
HCT: 38.9 % (ref 36.0–46.0)
Hemoglobin: 13.7 g/dL (ref 12.0–15.0)
MCH: 29.3 pg (ref 26.0–34.0)
MCHC: 35.2 g/dL (ref 30.0–36.0)
MCV: 83.3 fL (ref 80.0–100.0)
Platelets: 166 10*3/uL (ref 150–400)
RBC: 4.67 MIL/uL (ref 3.87–5.11)
RDW: 13.8 % (ref 11.5–15.5)
WBC: 10.7 10*3/uL — ABNORMAL HIGH (ref 4.0–10.5)
nRBC: 0 % (ref 0.0–0.2)

## 2020-04-26 LAB — TYPE AND SCREEN
ABO/RH(D): A POS
Antibody Screen: NEGATIVE

## 2020-04-26 MED ORDER — MISOPROSTOL 200 MCG PO TABS
ORAL_TABLET | ORAL | Status: AC
  Filled 2020-04-26: qty 4

## 2020-04-26 MED ORDER — OXYTOCIN 10 UNIT/ML IJ SOLN
INTRAMUSCULAR | Status: AC
  Filled 2020-04-26: qty 2

## 2020-04-26 MED ORDER — SODIUM CHLORIDE (PF) 0.9 % IJ SOLN
INTRAMUSCULAR | Status: AC
  Filled 2020-04-26: qty 50

## 2020-04-26 MED ORDER — TERBUTALINE SULFATE 1 MG/ML IJ SOLN: 0.2500 mg | Freq: Once | INTRAMUSCULAR | Status: DC | PRN

## 2020-04-26 MED ORDER — OXYTOCIN BOLUS FROM INFUSION
500.0000 mL | Freq: Once | INTRAVENOUS | Status: AC
  Administered 2020-04-27: 500 mL via INTRAVENOUS

## 2020-04-26 MED ORDER — LACTATED RINGERS IV SOLN: INTRAVENOUS | Status: DC

## 2020-04-26 MED ORDER — OXYTOCIN-SODIUM CHLORIDE 30-0.9 UT/500ML-% IV SOLN: 1.0000 m[IU]/min | INTRAVENOUS | Status: DC

## 2020-04-26 MED ORDER — OXYTOCIN-SODIUM CHLORIDE 30-0.9 UT/500ML-% IV SOLN
2.5000 [IU]/h | INTRAVENOUS | Status: DC
  Filled 2020-04-26: qty 1000

## 2020-04-26 MED ORDER — BUTORPHANOL TARTRATE 1 MG/ML IJ SOLN
1.0000 mg | INTRAMUSCULAR | Status: DC | PRN
  Administered 2020-04-27: 1 mg via INTRAVENOUS
  Filled 2020-04-26: qty 1

## 2020-04-26 MED ORDER — AMMONIA AROMATIC IN INHA
RESPIRATORY_TRACT | Status: AC
  Filled 2020-04-26: qty 10

## 2020-04-26 MED ORDER — LIDOCAINE HCL (PF) 1 % IJ SOLN
30.0000 mL | INTRAMUSCULAR | Status: DC | PRN
  Filled 2020-04-26: qty 30

## 2020-04-26 MED ORDER — ACETAMINOPHEN 325 MG PO TABS: 650.0000 mg | ORAL_TABLET | ORAL | Status: DC | PRN

## 2020-04-26 MED ORDER — MISOPROSTOL 100 MCG PO TABS
25.0000 ug | ORAL_TABLET | ORAL | Status: DC | PRN
  Administered 2020-04-26 – 2020-04-27 (×3): 25 ug via VAGINAL
  Filled 2020-04-26 (×5): qty 1

## 2020-04-26 MED ORDER — LACTATED RINGERS IV SOLN
500.0000 mL | INTRAVENOUS | Status: DC | PRN
  Administered 2020-04-27: 500 mL via INTRAVENOUS

## 2020-04-26 MED ORDER — ZOLPIDEM TARTRATE 5 MG PO TABS
5.0000 mg | ORAL_TABLET | Freq: Every evening | ORAL | Status: DC | PRN
  Administered 2020-04-26: 5 mg via ORAL
  Filled 2020-04-26: qty 1

## 2020-04-26 MED ORDER — ONDANSETRON HCL 4 MG/2ML IJ SOLN: 4.0000 mg | Freq: Four times a day (QID) | INTRAMUSCULAR | Status: DC | PRN

## 2020-04-26 NOTE — H&P (Signed)
History and Physical Interval Note:  04/26/2020 5:31 AM  Angela Nunez  has presented today for INDUCTION OF LABOR (cervical ripening agents),  with the diagnosis of Favorable cervix at term with [facility] 04/24/2020. The various methods of treatment have been discussed with the patient and family. After consideration of risks, benefits and other options for treatment, the patient has consented to  Labor induction .  The patient's history has been reviewed, patient examined, no change in status, and is stable for induction as planned.  See H&P. I have reviewed the patient's chart and labs.  Questions were answered to the patient's satisfaction.    Annamarie Major, MD, [facility], [facility] 04/26/2020  5:31 AM

## 2020-04-26 NOTE — Progress Notes (Signed)
Angela Nunez is a 29 y.o. G1P0000 at [redacted]w[redacted]d by ultrasound admitted for induction of labor due to Post dates. Due date 04/24/2020.  Subjective: Very comfortable. Still unaware of contractions.   Objective: BP (!) 143/84   Pulse (!) 107   Temp 98.4 F (36.9 C) (Oral)   Resp 16   Ht 5\' 1"  (1.549 m)   Wt 90.7 kg   LMP 07/19/2019 (Exact Date)   BMI 37.79 kg/m  No intake/output data recorded. No intake/output data recorded.  FHT:  FHR: 135 baseline bpm, variability: moderate,  accelerations:  Present,  decelerations:  Absent UC:   Infrequent, mild to touch SVE:   Dilation: 1.5 Effacement (%): 50 Station: -3 Exam by:: M.Khaleb Broz CNM  Labs: Lab Results  Component Value Date   WBC 10.7 (H) 04/26/2020   HGB 13.7 04/26/2020   HCT 38.9 04/26/2020   MCV 83.3 04/26/2020   PLT 166 04/26/2020    Assessment / Plan: Ongoing cervical ripening with Cytotec  Labor: Progressing normally Preeclampsia:  NA Fetal Wellbeing:  Category I Pain Control:  not needed or requested. Plans epidural I/D:  n/a Anticipated MOD:  NSVD  06/26/2020 04/26/2020, 11:06 AM

## 2020-04-26 NOTE — Progress Notes (Signed)
Angela Nunez is a 29 y.o. G1P0000 at [redacted]w[redacted]d by ultrasound admitted for induction of labor due to post term.  Subjective:   Objective: BP 138/88   Pulse 96   Temp 98.4 F (36.9 C) (Oral)   Resp 16   Ht 5\' 1"  (1.549 m)   Wt 90.7 kg   LMP 07/19/2019 (Exact Date)   BMI 37.79 kg/m  No intake/output data recorded. No intake/output data recorded.  FHT:  FHR: 135 baseline bpm, variability: moderate,  accelerations:  Present,  decelerations:  Absent UC:   regular, every 2-4 minutes, mild but now felt by SVE:   Dilation: 1.5 Effacement (%): 50 Station: -3 Exam by:: M.Aziel Morgan CNM  Labs: Lab Results  Component Value Date   WBC 10.7 (H) 04/26/2020   HGB 13.7 04/26/2020   HCT 38.9 04/26/2020   MCV 83.3 04/26/2020   PLT 166 04/26/2020    Assessment / Plan: Ongoing cervical ripening with cytotec  Will recheck in 1-2 hours. Consider foley ball placement  Labor: Progressing normally Preeclampsia:  na Fetal Wellbeing:  Category I Pain Control:  nothing at present I/D:  n/a Anticipated MOD:  NSVD  06/26/2020 04/26/2020, 3:17 PM

## 2020-04-26 NOTE — Progress Notes (Signed)
Angela Nunez is a 29 y.o. G1P0000 at [redacted]w[redacted]d by ultrasound admitted for induction of labor due to Post dates. Due date 04/24/2020.  Subjective  She is comfortable, resting in bed. Her mother and FOB are present and supportive. Not feeling any contractions. Denies vaginal bleeding or LOF. Onging Cytotec IOL.   Objective: BP (!) 143/84   Pulse (!) 110   Temp 98.1 F (36.7 C) (Oral)   Resp 20   Ht 5\' 1"  (1.549 m)   Wt 90.7 kg   LMP 07/19/2019 (Exact Date)   BMI 37.79 kg/m  No intake/output data recorded. No intake/output data recorded.  FHT:  FHR: 135 baseline bpm, variability: moderate,  accelerations:  Present,  decelerations:  Absent UC:   Infrequent, mild SVE:   Dilation: 1 Effacement (%): 50 Station: -2 Exam by:: K Allred RN  Labs: Lab Results  Component Value Date   WBC 10.7 (H) 04/26/2020   HGB 13.7 04/26/2020   HCT 38.9 04/26/2020   MCV 83.3 04/26/2020   PLT 166 04/26/2020    Assessment / Plan: Induction of labor due to postterm,  progressing well on pitocin  Labor: Cytotec for cervical ripening in process Preeclampsia:  NA Fetal Wellbeing:  Category I Pain Control:  not needed at this time I/D:  n/a Anticipated MOD:  NSVD  06/26/2020 04/26/2020, 9:55 AM

## 2020-04-26 NOTE — Progress Notes (Signed)
Angela Nunez is a 29 y.o. G1P0000 at [redacted]w[redacted]d by ultrasound admitted for induction of labor due to post due dates..  Subjective  Can feel hr contractions, but talks through them. Denies any LOF or vaginal bleeding. Has taken a nap, eaten throughout the day. Objective: BP 138/88   Pulse 96   Temp 98.4 F (36.9 C) (Oral)   Resp 16   Ht 5\' 1"  (1.549 m)   Wt 90.7 kg   LMP 07/19/2019 (Exact Date)   BMI 37.79 kg/m  No intake/output data recorded. Total I/O In: 125 [I.V.:125] Out: -   FHT:  FHR: 140 baseline bpm, variability: moderate,  accelerations:  Present,  decelerations:  Absent UC:   regular, every 3 minutes SVE:   Dilation: 1.5 Effacement (%): 70 Station: -3 Exam by:: [email or web address] CNM  Labs: Lab Results  Component Value Date   WBC 10.7 (H) 04/26/2020   HGB 13.7 04/26/2020   HCT 38.9 04/26/2020   MCV 83.3 04/26/2020   PLT 166 04/26/2020    Assessment / Plan: Induction of labor due to postterm,  Continued cervical ripening with Cytotec  Labor: Progressing normally Preeclampsia:  na Fetal Wellbeing:  Category I Pain Control:  changing positions, using the rocker I/D:  n/a Anticipated MOD:  NSVD   Discussed options for continued cervical ripening. Offered foley ball placement, and patient educated on its placement, mechanism of action. With her consent, a an 18 g foley is placed, and inflated with 35 cc sterile water. Will monitor and re evaluate in 4 hours or PRN.    06/26/2020, CNM  04/26/2020 6:50 PM   06/26/2020 04/26/2020, 6:21 PM

## 2020-04-27 ENCOUNTER — Inpatient Hospital Stay: Payer: BC Managed Care – PPO | Admitting: Anesthesiology

## 2020-04-27 ENCOUNTER — Encounter: Payer: Self-pay | Admitting: Advanced Practice Midwife

## 2020-04-27 DIAGNOSIS — Z3400 Encounter for supervision of normal first pregnancy, unspecified trimester: Secondary | ICD-10-CM

## 2020-04-27 LAB — RPR: RPR Ser Ql: NONREACTIVE

## 2020-04-27 MED ORDER — PRENATAL MULTIVITAMIN CH
1.0000 | ORAL_TABLET | Freq: Every day | ORAL | Status: DC
  Administered 2020-04-28: 1 via ORAL
  Filled 2020-04-27: qty 1

## 2020-04-27 MED ORDER — ZOLPIDEM TARTRATE 5 MG PO TABS: 5.0000 mg | ORAL_TABLET | Freq: Every evening | ORAL | Status: DC | PRN

## 2020-04-27 MED ORDER — PHENYLEPHRINE 40 MCG/ML (10ML) SYRINGE FOR IV PUSH (FOR BLOOD PRESSURE SUPPORT)
80.0000 ug | PREFILLED_SYRINGE | INTRAVENOUS | Status: DC | PRN
  Filled 2020-04-27: qty 10

## 2020-04-27 MED ORDER — DIPHENHYDRAMINE HCL 50 MG/ML IJ SOLN: 12.5000 mg | INTRAMUSCULAR | Status: DC | PRN

## 2020-04-27 MED ORDER — FENTANYL 2.5 MCG/ML W/ROPIVACAINE 0.15% IN NS 100 ML EPIDURAL (ARMC)
EPIDURAL | Status: DC | PRN
  Administered 2020-04-27: 12 mL/h via EPIDURAL

## 2020-04-27 MED ORDER — IBUPROFEN 600 MG PO TABS
600.0000 mg | ORAL_TABLET | Freq: Four times a day (QID) | ORAL | Status: DC
  Administered 2020-04-27 – 2020-04-28 (×4): 600 mg via ORAL
  Filled 2020-04-27 (×4): qty 1

## 2020-04-27 MED ORDER — OXYTOCIN-SODIUM CHLORIDE 30-0.9 UT/500ML-% IV SOLN: 1.0000 m[IU]/min | INTRAVENOUS | Status: DC

## 2020-04-27 MED ORDER — LACTATED RINGERS IV SOLN: 500.0000 mL | Freq: Once | INTRAVENOUS | Status: DC

## 2020-04-27 MED ORDER — OXYTOCIN-SODIUM CHLORIDE 30-0.9 UT/500ML-% IV SOLN
1.0000 m[IU]/min | INTRAVENOUS | Status: DC
  Administered 2020-04-27: 1 m[IU]/min via INTRAVENOUS

## 2020-04-27 MED ORDER — ONDANSETRON HCL 4 MG/2ML IJ SOLN: 4.0000 mg | INTRAMUSCULAR | Status: DC | PRN

## 2020-04-27 MED ORDER — BENZOCAINE-MENTHOL 20-0.5 % EX AERO
1.0000 "application " | INHALATION_SPRAY | CUTANEOUS | Status: DC | PRN
  Administered 2020-04-27: 1 via TOPICAL
  Filled 2020-04-27: qty 56

## 2020-04-27 MED ORDER — DIBUCAINE (PERIANAL) 1 % EX OINT: 1.0000 "application " | TOPICAL_OINTMENT | CUTANEOUS | Status: DC | PRN

## 2020-04-27 MED ORDER — FENTANYL 2.5 MCG/ML W/ROPIVACAINE 0.15% IN NS 100 ML EPIDURAL (ARMC)
EPIDURAL | Status: AC
  Filled 2020-04-27: qty 100

## 2020-04-27 MED ORDER — DIPHENHYDRAMINE HCL 25 MG PO CAPS: 25.0000 mg | ORAL_CAPSULE | Freq: Four times a day (QID) | ORAL | Status: DC | PRN

## 2020-04-27 MED ORDER — SIMETHICONE 80 MG PO CHEW: 80.0000 mg | CHEWABLE_TABLET | ORAL | Status: DC | PRN

## 2020-04-27 MED ORDER — LIDOCAINE-EPINEPHRINE (PF) 1.5 %-1:200000 IJ SOLN
INTRAMUSCULAR | Status: DC | PRN
  Administered 2020-04-27: 3 mL via EPIDURAL

## 2020-04-27 MED ORDER — COCONUT OIL OIL
1.0000 "application " | TOPICAL_OIL | Status: DC | PRN
  Administered 2020-04-27: 1 via TOPICAL
  Filled 2020-04-27: qty 120

## 2020-04-27 MED ORDER — SODIUM CHLORIDE 0.9 % IV SOLN
INTRAVENOUS | Status: DC | PRN
  Administered 2020-04-27 (×2): 5 mL via EPIDURAL

## 2020-04-27 MED ORDER — EPHEDRINE 5 MG/ML INJ
10.0000 mg | INTRAVENOUS | Status: DC | PRN
  Filled 2020-04-27: qty 2

## 2020-04-27 MED ORDER — LIDOCAINE HCL (PF) 1 % IJ SOLN
INTRAMUSCULAR | Status: DC | PRN
  Administered 2020-04-27: 3 mL via SUBCUTANEOUS

## 2020-04-27 MED ORDER — TETANUS-DIPHTH-ACELL PERTUSSIS 5-2.5-18.5 LF-MCG/0.5 IM SUSP: 0.5000 mL | Freq: Once | INTRAMUSCULAR | Status: DC

## 2020-04-27 MED ORDER — ONDANSETRON HCL 4 MG PO TABS: 4.0000 mg | ORAL_TABLET | ORAL | Status: DC | PRN

## 2020-04-27 MED ORDER — SENNOSIDES-DOCUSATE SODIUM 8.6-50 MG PO TABS
2.0000 | ORAL_TABLET | ORAL | Status: DC
  Filled 2020-04-27: qty 2

## 2020-04-27 MED ORDER — OXYCODONE HCL 5 MG PO TABS: 10.0000 mg | ORAL_TABLET | ORAL | Status: DC | PRN

## 2020-04-27 MED ORDER — OXYCODONE HCL 5 MG PO TABS: 5.0000 mg | ORAL_TABLET | ORAL | Status: DC | PRN

## 2020-04-27 MED ORDER — FENTANYL 2.5 MCG/ML W/ROPIVACAINE 0.15% IN NS 100 ML EPIDURAL (ARMC): 12.0000 mL/h | EPIDURAL | Status: DC

## 2020-04-27 MED ORDER — ACETAMINOPHEN 325 MG PO TABS
650.0000 mg | ORAL_TABLET | ORAL | Status: DC | PRN
  Administered 2020-04-28: 650 mg via ORAL
  Filled 2020-04-27: qty 2

## 2020-04-27 MED ORDER — TERBUTALINE SULFATE 1 MG/ML IJ SOLN: 0.2500 mg | Freq: Once | INTRAMUSCULAR | Status: DC | PRN

## 2020-04-27 MED ORDER — WITCH HAZEL-GLYCERIN EX PADS: 1.0000 "application " | MEDICATED_PAD | CUTANEOUS | Status: DC | PRN

## 2020-04-27 NOTE — Anesthesia Procedure Notes (Signed)
Epidural Patient location during procedure: OB Start time: 04/27/2020 9:53 AM End time: 04/27/2020 10:03 AM  Staffing Anesthesiologist: Lenard Simmer, MD Performed: anesthesiologist   Preanesthetic Checklist Completed: patient identified, IV checked, site marked, risks and benefits discussed, surgical consent, monitors and equipment checked, pre-op evaluation and timeout performed  Epidural Patient position: sitting Prep: ChloraPrep Patient monitoring: heart rate, continuous pulse ox and blood pressure Approach: midline Location: L3-L4 Injection technique: LOR saline  Needle:  Needle type: Tuohy  Needle gauge: 17 G Needle length: 9 cm and 9 Needle insertion depth: 5.5 cm Catheter type: closed end flexible Catheter size: 19 Gauge Catheter at skin depth: 10.5 cm Test dose: negative and 1.5% lidocaine with Epi 1:200 K  Assessment Sensory level: T10 Events: blood not aspirated, injection not painful, no injection resistance, no paresthesia and negative IV test  Additional Notes 1st attempt Pt. Evaluated and documentation done after procedure finished. Patient identified. Risks/Benefits/Options discussed with patient including but not limited to bleeding, infection, nerve damage, paralysis, failed block, incomplete pain control, headache, blood pressure changes, nausea, vomiting, reactions to medication both or allergic, itching and postpartum back pain. Confirmed with bedside nurse the patient's most recent platelet count. Confirmed with patient that they are not currently taking any anticoagulation, have any bleeding history or any family history of bleeding disorders. Patient expressed understanding and wished to proceed. All questions were answered. Sterile technique was used throughout the entire procedure. Please see nursing notes for vital signs. Test dose was given through epidural catheter and negative prior to continuing to dose epidural or start infusion. Warning signs of high  block given to the patient including shortness of breath, tingling/numbness in hands, complete motor block, or any concerning symptoms with instructions to call for help. Patient was given instructions on fall risk and not to get out of bed. All questions and concerns addressed with instructions to call with any issues or inadequate analgesia.   Patient tolerated the insertion well without immediate complications.Reason for block:procedure for pain

## 2020-04-27 NOTE — Progress Notes (Signed)
Angela Nunez is a 29 y.o. G1P0000 at [redacted]w[redacted]d by ultrasound admitted for induction of labor due to Post dates. Due date 04/24/2020.  Subjective:  Has rested with epidural for several hours. Feeling some occasional rectal and vaginal pressure. Objective: BP 119/65   Pulse (!) 107   Temp 98 F (36.7 C) (Oral)   Resp 16   Ht 5\' 1"  (1.549 m)   Wt 90.7 kg   LMP 07/19/2019 (Exact Date)   SpO2 99%   BMI 37.79 kg/m  I/O last 3 completed shifts: In: 125 [I.V.:125] Out: -  Total I/O In: 99 [I.V.:99] Out: 1200 [Urine:1200]  FHT:  FHR: 130s bpm, variability: moderate,  accelerations:  Present,  decelerations:  Absent UC:   regular, every 3 minutes, MVUs still less than 200, but pitocin is at 35mu/min SVE:   Dilation: 10 Effacement (%): 100 Station: 0 Exam by:: [email or web address] CNM  Labs: Lab Results  Component Value Date   WBC 10.7 (H) 04/26/2020   HGB 13.7 04/26/2020   HCT 38.9 04/26/2020   MCV 83.3 04/26/2020   PLT 166 04/26/2020    Assessment / Plan: Complete dilatation. Will commence pushing. Dr. 06/26/2020 updated on maternal/fetal status.  Labor: Second stage labor Preeclampsia:  na Fetal Wellbeing:  Category I Pain Control:  Epidural I/D:  n/a Anticipated MOD:  NSVD  Gaynelle Arabian 04/27/2020, 6:05 PM

## 2020-04-27 NOTE — Progress Notes (Addendum)
Liana Crocker, CNM notified of Pt. Heart Rates, Temp. Of 99.7 and B/P's  since arrival to Post Partum Unit. No new orders received except to obtain VS at 0000 as is Routine and notify CNM if Pt is still Tachy. And/or temperature is elevated.

## 2020-04-27 NOTE — Anesthesia Preprocedure Evaluation (Signed)
Anesthesia Evaluation  Patient identified by MRN, date of birth, ID band Patient awake    Reviewed: Allergy & Precautions, H&P , NPO status , Patient's Chart, lab work & pertinent test results, reviewed documented beta blocker date and time   History of Anesthesia Complications Negative for: history of anesthetic complications  Airway Mallampati: III  TM Distance: >3 FB Neck ROM: full    Dental no notable dental hx.    Pulmonary neg pulmonary ROS,    Pulmonary exam normal breath sounds clear to auscultation       Cardiovascular Exercise Tolerance: Good negative cardio ROS Normal cardiovascular exam Rhythm:regular Rate:Normal     Neuro/Psych PSYCHIATRIC DISORDERS Anxiety negative neurological ROS     GI/Hepatic Neg liver ROS, GERD  ,  Endo/Other  negative endocrine ROS  Renal/GU negative Renal ROS  negative genitourinary   Musculoskeletal   Abdominal   Peds  Hematology negative hematology ROS (+)   Anesthesia Other Findings Past Medical History: No date: Hypothyroidism No date: Vaccine for human papilloma virus (HPV) types 6, 11, 16, and  18 administered   Reproductive/Obstetrics (+) Pregnancy                             Anesthesia Physical Anesthesia Plan  ASA: II  Anesthesia Plan: Epidural   Post-op Pain Management:    Induction:   PONV Risk Score and Plan:   Airway Management Planned:   Additional Equipment:   Intra-op Plan:   Post-operative Plan:   Informed Consent: I have reviewed the patients History and Physical, chart, labs and discussed the procedure including the risks, benefits and alternatives for the proposed anesthesia with the patient or authorized representative who has indicated his/her understanding and acceptance.     Dental Advisory Given  Plan Discussed with: Anesthesiologist, CRNA and Surgeon  Anesthesia Plan Comments:          Anesthesia Quick Evaluation

## 2020-04-27 NOTE — Progress Notes (Signed)
Angela Nunez is a 29 y.o. G1P0000 at [redacted]w[redacted]d by ultrasound admitted for induction of labor at 40 + weeks.  Subjective:      She is tearful and moaning with contractions now. Was started on pitocin within the hour.Feeling additional pain in her lower leftquadrant. Denies any ROM. Some bloody show seen. She would like additional pain medication. Her mother and husband are at the bedside and very supportive. Objective: BP 132/85 (BP Location: Right Arm)   Pulse 97   Temp 98.5 F (36.9 C) (Oral)   Resp 20   Ht 5\' 1"  (1.549 m)   Wt 90.7 kg   LMP 07/19/2019 (Exact Date)   BMI 37.79 kg/m  I/O last 3 completed shifts: In: 125 [I.V.:125] Out: -  Total I/O In: 1 [I.V.:1] Out: -   FHT:  FHR: 135 baseline bpm, variability: moderate,  accelerations:  Present,  decelerations:  Absent UC:   regular, every 3-4 minutes SVE:   Dilation: 3.5 Effacement (%): 80 Station: -2 Exam by:: [email or web address] CNM  Bishop score:7  Labs: Lab Results  Component Value Date   WBC 10.7 (H) 04/26/2020   HGB 13.7 04/26/2020   HCT 38.9 04/26/2020   MCV 83.3 04/26/2020   PLT 166 04/26/2020    Assessment / Plan: Induction of labor due to postterm,  Now on pitocin infusion per protocol  Labor: Progressing normally Preeclampsia:  na Fetal Wellbeing:  Category I Pain Control:  Discussed options for pain control , including another dose of Stadol, epidural anesthesia. Risks and benefits of both described I/D:  n/a Anticipated MOD:  NSVD   Assisted up to the birthing ball. She will consider her options and may have Stadol first and then an epidural as she desires.  06/26/2020 04/27/2020, 9:00 AM

## 2020-04-27 NOTE — Plan of Care (Signed)
Transferred to Room 349 Post Partum. Alert and oriented with quiet affect. Color sl. Pale, skin w&d, Cap. Refill < 3 seconds. Positive Pedal pulses equal and strong. Fundus is firm at U/E with small amount of Lochia. Oriented to room, POC, Fall prevention and Consulting civil engineer. Patient v/o.

## 2020-04-27 NOTE — Discharge Summary (Addendum)
OB Discharge Summary     Patient Name: Angela Nunez DOB: 1991-06-23 MRN: [id number]  Date of admission: 04/26/2020 Delivering MD: Mirna Mires  Attending: Dr. Jerene Pitch Date of discharge: 04/28/2020  Admitting diagnosis: Encounter for induction of labor Intrauterine pregnancy: [redacted]w[redacted]d     Secondary diagnosis:  Active Problems:   Normal vaginal delivery   Postpartum care and examination of lactating mother  Additional problems: none     Discharge diagnosis: Term Pregnancy Delivered                                                                                                Post partum procedures:none  Augmentation: Pitocin and IP Foley  Complications: None  Hospital course:  Induction of Labor With Vaginal Delivery   29 y.o. yo G1P0000 at [redacted]w[redacted]d was admitted to the hospital 04/26/2020 for induction of labor.  Indication for induction: Postdates and Elective.  Patient had an uncomplicated labor course as follows: Membrane Rupture Time/Date: 5:50 AM ,04/27/2020   Delivery Method:Vaginal, Spontaneous  Episiotomy: None  Lacerations:  None  Details of delivery can be found in separate delivery note.  Patient had an uncomplicated postpartum course. On PPD #1, patient requested discharge. She was voiding, ambulating ad lib, tolerating a regular diet and was afebrile.   Patient was discharged home 04/28/20 in stable condition.  Newborn Data: Birth date:04/27/2020  Birth time:6:49 PM  Gender:Female  Living status:Living  Skyler Apgars:6 ,8  Weight:3230 g   Physical exam  Vitals:   04/28/20 0846 04/28/20 1153 04/28/20 1549 04/28/20 2008  BP: 121/73 117/76 121/84 134/88  Pulse: 88 92 99 (!) 102  Resp: 20 18 20 18   Temp: 98 F (36.7 C) 98.6 F (37 C) 98 F (36.7 C) 98 F (36.7 C)  TempSrc: Oral Oral Oral Oral  SpO2: 99% 99% 99% 98%  Weight:      Height:       General: alert, cooperative and no distress Lochia: appropriate Uterine Fundus: firm Incision: N/A DVT  Evaluation: No evidence of DVT seen on physical exam. Labs: Lab Results  Component Value Date   WBC 17.4 (H) 04/28/2020   HGB 13.7 04/28/2020   HCT 40.5 04/28/2020   MCV 86.5 04/28/2020   PLT 166 04/28/2020   CMP Latest Ref Rng & Units 04/26/2020  Glucose 70 - 99 mg/dL 06/26/2020)  BUN 6 - 20 mg/dL 8  Creatinine 789(F - 8.10 mg/dL 1.75  Sodium 1.02 - 585 mmol/L 136  Potassium 3.5 - 5.1 mmol/L 3.4(L)  Chloride 98 - 111 mmol/L 105  CO2 22 - 32 mmol/L 21(L)  Calcium 8.9 - 10.3 mg/dL 9.6  Total Protein 6.5 - 8.1 g/dL 6.5  Total Bilirubin 0.3 - 1.2 mg/dL 1.0  Alkaline Phos 38 - 126 U/L 169(H)  AST 15 - 41 U/L 20  ALT 0 - 44 U/L 11     Edinburgh Postnatal Depression Scale Screening Tool 04/28/2020 04/28/2020  I have been able to laugh and see the funny side of things. 0 0  I have looked forward with enjoyment to things. 0 0  I  have blamed myself unnecessarily when things went wrong. 0 0  I have been anxious or worried for no good reason. 2 2  I have felt scared or panicky for no good reason. 1 1  Things have been getting on top of me. 1 1  I have been so unhappy that I have had difficulty sleeping. 0 0  I have felt sad or miserable. 0 0  I have been so unhappy that I have been crying. 0 0  The thought of harming myself has occurred to me. 0 0  Edinburgh Postnatal Depression Scale Total 4 4     Discharge instruction: per After Visit Summary and "Baby and Me Booklet".  After visit meds:  Allergies as of 04/28/2020   No Known Allergies     Medication List    TAKE these medications   acetaminophen 325 MG tablet Commonly known as: Tylenol Take 2 tablets (650 mg total) by mouth every 4 (four) hours as needed (for pain scale < 4).   augmented betamethasone dipropionate 0.05 % ointment Commonly known as: DIPROLENE-AF   hydrOXYzine 25 MG tablet Commonly known as: ATARAX/VISTARIL Take 25 mg by mouth 3 (three) times daily as needed.   ibuprofen 600 MG tablet Commonly known as:  ADVIL Take 1 tablet (600 mg total) by mouth every 6 (six) hours as needed.   norethindrone 0.35 MG tablet Commonly known as: Errin Take 1 tablet (0.35 mg total) by mouth daily. Start taking on: May 25, 2020   PRENATAL VITAMIN PO Take by mouth.       Diet: routine diet  Activity: Advance as tolerated. Pelvic rest for 6 weeks.   Outpatient follow up: 2 weeks for mood check Follow up Appt:No future appointments. Follow up Visit:No follow-ups on file.  Postpartum contraception: Progesterone only pills  Newborn Data: Live born female Skyler Birth Weight: 7 lb 1.9 oz (3230 g) APGAR: 6, 8  Newborn Delivery   Birth date/time: 04/27/2020 18:49:00 Delivery type: Vaginal, Spontaneous      Baby Feeding: Breast Disposition:home with mother   04/28/2020 Dalia Heading, CNM

## 2020-04-27 NOTE — Progress Notes (Signed)
Angela Nunez is a 29 y.o. G1P0000 at [redacted]w[redacted]d by ultrasound admitted for induction of labor due to Post dates. Due date 04/27/20.continues with induction of labor, now on pitocin..She received an epidural and is now very comfortable. Her husband and mother are present and supportive in the room.  Subjective:   Objective: BP (!) 112/56   Pulse 89   Temp 98 F (36.7 C) (Oral)   Resp 16   Ht 5\' 1"  (1.549 m)   Wt 90.7 kg   LMP 07/19/2019 (Exact Date)   SpO2 99%   BMI 37.79 kg/m  I/O last 3 completed shifts: In: 125 [I.V.:125] Out: -  Total I/O In: 39 [I.V.:39] Out: -   FHT:  FHR: 135 baseline bpm, variability: moderate,  accelerations:  Present,  decelerations:  Absent UC:   regular, every 3-4 minutes, mild to moderate in strength via palpation. Pitocin at 8MU/MIN SVE:   Dilation: 4.5 Effacement (%): 90 Station: -1 Exam by:: [email or web address] CNM  Labs: Lab Results  Component Value Date   WBC 10.7 (H) 04/26/2020   HGB 13.7 04/26/2020   HCT 38.9 04/26/2020   MCV 83.3 04/26/2020   PLT 166 04/26/2020    Assessment / Plan: Augmentation of labor, progressing well  SROM since 0550- clear fluid noted.  Labor: Progressing normally Preeclampsia:  na Fetal Wellbeing:  Category I Pain Control:  Epidural I/D:  n/a Anticipated MOD:  NSVD  06/26/2020 04/27/2020, 2:25PM

## 2020-04-28 LAB — CBC
HCT: 40.5 % (ref 36.0–46.0)
Hemoglobin: 13.7 g/dL (ref 12.0–15.0)
MCH: 29.3 pg (ref 26.0–34.0)
MCHC: 33.8 g/dL (ref 30.0–36.0)
MCV: 86.5 fL (ref 80.0–100.0)
Platelets: 166 10*3/uL (ref 150–400)
RBC: 4.68 MIL/uL (ref 3.87–5.11)
RDW: 13.9 % (ref 11.5–15.5)
WBC: 17.4 10*3/uL — ABNORMAL HIGH (ref 4.0–10.5)
nRBC: 0 % (ref 0.0–0.2)

## 2020-04-28 MED ORDER — ACETAMINOPHEN 325 MG PO TABS: 650.0000 mg | ORAL_TABLET | ORAL | Status: DC | PRN

## 2020-04-28 MED ORDER — NORETHINDRONE 0.35 MG PO TABS: 1.0000 | ORAL_TABLET | Freq: Every day | ORAL | 11 refills | Status: DC

## 2020-04-28 MED ORDER — IBUPROFEN 600 MG PO TABS: 600.0000 mg | ORAL_TABLET | Freq: Four times a day (QID) | ORAL | 1 refills | Status: DC | PRN

## 2020-04-28 NOTE — Progress Notes (Signed)
Post Partum Day 1 Subjective: up ad lib, voiding and tolerating PO. Breast feeding going well.  Objective: Blood pressure 117/76, pulse 92, temperature 98.6 F (37 C), temperature source Oral, resp. rate 18, height 5\' 1"  (1.549 m), weight 90.7 kg, last menstrual period 07/19/2019, SpO2 99 %.  Physical Exam:  General: alert, cooperative and no distress, tired Lochia: appropriate Uterine Fundus: firm/ U-1/ML/NT  DVT Evaluation: No evidence of DVT seen on physical exam.  Recent Labs    04/26/20 0545 04/28/20 0707  HGB 13.7 13.7  HCT 38.9 40.5  WBC 10.7* 17.4*  PLT 166 166    Assessment/Plan: Stable PPD #1-continue postpartum care  Support breast feeding as needed  D/C saline lock A POS/ RI/ VI Breast Contraception; undecided. TDAP given 4/6 Probable discharge in AM.  6/6, CNM    LOS: 2 days   Farrel Conners 04/28/2020, 1:05 PM

## 2020-04-28 NOTE — Anesthesia Postprocedure Evaluation (Signed)
Anesthesia Post Note  Patient: Angela Nunez  Procedure(s) Performed: AN AD HOC LABOR EPIDURAL  Patient location during evaluation: Mother Baby Anesthesia Type: Epidural Level of consciousness: oriented and awake and alert Pain management: pain level controlled Vital Signs Assessment: post-procedure vital signs reviewed and stable Respiratory status: spontaneous breathing and respiratory function stable Cardiovascular status: blood pressure returned to baseline and stable Postop Assessment: no headache, no backache, no apparent nausea or vomiting and able to ambulate Anesthetic complications: no     Last Vitals:  Vitals:   04/28/20 0000 04/28/20 0338  BP: 119/68 133/75  Pulse: 99 85  Resp: 20 18  Temp: 36.8 C 36.6 C  SpO2: 99% 99%    Last Pain:  Vitals:   04/28/20 0345  TempSrc:   PainSc: 0-No pain                 Starling Manns

## 2020-04-28 NOTE — Discharge Summary (Signed)
Pt dc and follow appt reviewed, no immunizations required for dc, prescriptions sent electronically to [facility]. pt verbalizes understanding of all dc instructions. Pt dc'd to home via w/c with infant in arms by CNA. Placed in backseat facing rear by family member.

## 2020-04-28 NOTE — Discharge Instructions (Signed)
Vaginal Delivery, Care After Refer to this sheet in the next few weeks. These discharge instructions provide you with information on caring for yourself after delivery. Your caregiver may also give you specific instructions. Your treatment has been planned according to the most current medical practices available, but problems sometimes occur. Call your caregiver if you have any problems or questions after you go home. HOME CARE INSTRUCTIONS 1. Take over-the-counter or prescription medicines only as directed by your caregiver or pharmacist. 2. Do not drink alcohol, especially if you are breastfeeding or taking medicine to relieve pain. 3. Do not smoke tobacco. 4. Continue to use good perineal care. Good perineal care includes: 1. Wiping your perineum from back to front 2. Keeping your perineum clean. 3. You can do sitz baths twice a day, to help keep this area clean 5. Do not use tampons, douche or have sex for 6 weeks 6. Shower only and avoid sitting in submerged water, aside from sitz baths 7. Wear a well-fitting bra that provides breast support. 8. Eat healthy foods. 9. Drink enough fluids to keep your urine clear or pale yellow. 10. Eat high-fiber foods such as whole grain cereals and breads, brown rice, beans, and fresh fruits and vegetables every day. These foods may help prevent or relieve constipation. 11. Avoid constipation with high fiber foods or medications, such as miralax or metamucil 12. Follow your caregiver's recommendations regarding resumption of activities such as climbing stairs, driving, lifting, exercising, or traveling. 42. Talk to your caregiver about resuming sexual activities. Resumption of sexual activities after 6 weeks is dependent upon your risk of infection, your rate of healing, and your comfort and desire to resume sexual activity. 14. Try to have someone help you with your household activities and your newborn for at least a few days after you leave the  hospital. 15. Rest as much as possible. Try to rest or take a nap when your newborn is sleeping. 16. Increase your activities gradually. 70. Keep all of your scheduled postpartum appointments. It is very important to keep your scheduled follow-up appointments. At these appointments, your caregiver will be checking to make sure that you are healing physically and emotionally. SEEK MEDICAL CARE IF:   You are passing large clots from your vagina. Save any clots to show your caregiver.  You have a foul smelling discharge from your vagina.  You have trouble urinating.  You are urinating frequently.  You have pain when you urinate.  You have a change in your bowel movements.  You have increasing redness, pain, or swelling near your vaginal incision (episiotomy) or vaginal tear.  You have pus draining from your episiotomy or vaginal tear.  Your episiotomy or vaginal tear is separating.  You have painful, hard, or reddened breasts.  You have a severe headache.  You have blurred vision or see spots.  You feel sad or depressed.  You have thoughts of hurting yourself or your newborn.  You have questions about your care, the care of your newborn, or medicines.  You are dizzy or light-headed.  You have a rash.  You have nausea or vomiting.  You were breastfeeding and have not had a menstrual period within 12 weeks after you stopped breastfeeding.  You are not breastfeeding and have not had a menstrual period by the 12th week after delivery.  You have a fever of 100.5 or more SEEK IMMEDIATE MEDICAL CARE IF:   You have persistent pain.  You have chest pain.  You have shortness  of breath.  You faint.  You have leg pain.  You have stomach pain.  Your vaginal bleeding saturates two or more sanitary pads in 1 hour. MAKE SURE YOU:   Understand these instructions.  Will watch your condition.  Will get help right away if you are not doing well or get worse. Document  Released: 11/05/2000 Document Revised: 03/25/2014 Document Reviewed: 07/05/2012 [facility] Patient Information 2015 [place], [place]. This information is not intended to replace advice given to you by your health care provider. Make sure you discuss any questions you have with your health care provider.  Sitz Bath A sitz bath is a warm water bath taken in the sitting position. The water covers only the hips and butt (buttocks). We recommend using one that fits in the toilet, to help with ease of use and cleanliness. It may be used for either healing or cleaning purposes. Sitz baths are also used to relieve pain, itching, or muscle tightening (spasms). The water may contain medicine. Moist heat will help you heal and relax.  HOME CARE  Take 3 to 4 sitz baths a day. 18. Fill the bathtub half-full with warm water. 19. Sit in the water and open the drain a little. 20. Turn on the warm water to keep the tub half-full. Keep the water running constantly. 21. Soak in the water for 15 to 20 minutes. 22. After the sitz bath, pat the affected area dry. GET HELP RIGHT AWAY IF: You get worse instead of better. Stop the sitz baths if you get worse. MAKE SURE YOU:  Understand these instructions.  Will watch your condition.  Will get help right away if you are not doing well or get worse. Document Released: 12/16/2004 Document Revised: 08/02/2012 Document Reviewed: 03/08/2011 [facility] Patient Information 2015 [place], [place]. This information is not intended to replace advice given to you by your health care provider. Make sure you discuss any questions you have with your health care provider.   Breast Pumping Tips Breast pumping is a way to get milk out of your breasts. You will then store the milk for your baby to use when you are away from home. There are three ways to pump. You can:  Use your hand to massage and squeeze your breast (hand expression).  Use a hand-held machine to manually pump your  milk.  Use an electric machine to pump your milk. In the beginning you may not get much milk. After a few days your breasts should make more. Pumping can help you start making milk after your baby is born. Pumping helps you to keep making milk when you are away from your baby. When should I pump? You can start pumping soon after your baby is born. Follow these tips:  When you are with your baby: ? Pump after you breastfeed. ? Pump from the free breast while you breastfeed.  When you are away from your baby: ? Pump every 2-3 hours for 15 minutes. ? Pump both breasts at the same time if you can.  If your baby drinks formula, pump around the time your baby gets the formula.  If you drank alcohol, wait 2 hours before you pump.  If you are going to have surgery, ask your doctor when you should pump again. How do I get ready to pump? Take steps to relax. Try these things to help your milk come in:  Smell your baby's blanket or clothes.  Look at a picture or video of your baby.  Sit  in a quiet, private space.  Massage your breast and nipple.  Place a cloth on your breast. The cloth should be warm and a little wet.  Play relaxing music.  Picture your milk flowing. What are some tips? General tips for pumping breast milk   Always wash your hands before pumping.  If you do not get much milk or if pumping hurts, try different pump settings or a different kind of pump.  Drink enough fluid so your pee (urine) is clear or pale yellow.  Wear clothing that opens in the front or is easy to take off.  Pump milk into a clean bottle or container.  Do not use anything that has nicotine or tobacco. Examples are cigarettes and e-cigarettes. If you need help quitting, ask your doctor. Tips for storing breast milk   Store breast milk in a clean, BPA-free container. These include: ? A glass or plastic bottle. ? A milk storage bag.  Store only 2-4 ounces of breast milk in each  container.  Swirl the breast milk in the container. Do not shake it.  Write down the date you pumped the milk on the container.  This is how long you can store breast milk: ? Room temperature: 6-8 hours. It is best to use the milk within 4 hours. ? Cooler with ice packs: 24 hours. ? Refrigerator: 5-8 days, if the milk is clean. It is best to use the milk within 3 days. ? Freezer: 9-12 months, if the milk is clean and stored away from the freezer door. It is best to use the milk within 6 months.  Put milk in the back of the refrigerator or freezer.  Thaw frozen milk using warm water. Do not use the microwave. Tips for choosing a breast pump When choosing a pump, keep the following things in mind:  Manual breast pumps do not need electricity. They cost less. They can be hard to use.  Electric breast pumps use electricity. They are more expensive. They are easier to use. They collect more milk.  The suction cup (flange) should be the right size.  Before you buy the pump, check if your insurance will pay for it. Tips for caring for a breast pump  Check the manual that came with your pump for cleaning tips.  Clean the pump after you use it. To do this: ? Wipe down the electrical part. Use a dry cloth or paper towel. Do not put this part in water or in cleaning products. ? Wash the plastic parts with soap and warm water. Or use the dishwasher if the manual says it is safe. You do not need to clean the tubing unless it touched breast milk. ? Let all the parts air dry. Avoid drying them with a cloth or towel. ? When the parts are clean and dry, put the pump back together. Then store the pump.  If there is water in the tubing when you want to pump: 1. Attach the tubing to the pump. 2. Turn on the pump. 3. Turn off the pump when the tube is dry.  Try not to touch the inside of pump parts. Summary  Pumping can help you start making milk after your baby is born. It lets you keep making  milk when you are away from your baby.  When you are away from your baby, pump for about 15 minutes every 2-3 hours. Pump both breasts at the same time, if you can. This information is not intended to  replace advice given to you by your health care provider. Make sure you discuss any questions you have with your health care provider. Document Revised: 02/28/2019 Document Reviewed: 12/13/2016 Elsevier Patient Education  2020 [employer]  Breast Pumping Tips Breast pumping is a way to get milk out of your breasts. You will then store the milk for your baby to use when you are away from home. There are three ways to pump. You can:  Use your hand to massage and squeeze your breast (hand expression).  Use a hand-held machine to manually pump your milk.  Use an electric machine to pump your milk. In the beginning you may not get much milk. After a few days your breasts should make more. Pumping can help you start making milk after your baby is born. Pumping helps you to keep making milk when you are away from your baby. When should I pump? You can start pumping soon after your baby is born. Follow these tips:  When you are with your baby: ? Pump after you breastfeed. ? Pump from the free breast while you breastfeed.  When you are away from your baby: ? Pump every 2-3 hours for 15 minutes. ? Pump both breasts at the same time if you can.  If your baby drinks formula, pump around the time your baby gets the formula.  If you drank alcohol, wait 2 hours before you pump.  If you are going to have surgery, ask your doctor when you should pump again. How do I get ready to pump? Take steps to relax. Try these things to help your milk come in:  Smell your baby's blanket or clothes.  Look at a picture or video of your baby.  Sit in a quiet, private space.  Massage your breast and nipple.  Place a cloth on your breast. The cloth should be warm and a little wet.  Play relaxing  music.  Picture your milk flowing. What are some tips? General tips for pumping breast milk   Always wash your hands before pumping.  If you do not get much milk or if pumping hurts, try different pump settings or a different kind of pump.  Drink enough fluid so your pee (urine) is clear or pale yellow.  Wear clothing that opens in the front or is easy to take off.  Pump milk into a clean bottle or container.  Do not use anything that has nicotine or tobacco. Examples are cigarettes and e-cigarettes. If you need help quitting, ask your doctor. Tips for storing breast milk   Store breast milk in a clean, BPA-free container. These include: ? A glass or plastic bottle. ? A milk storage bag.  Store only 2-4 ounces of breast milk in each container.  Swirl the breast milk in the container. Do not shake it.  Write down the date you pumped the milk on the container.  This is how long you can store breast milk: ? Room temperature: 6-8 hours. It is best to use the milk within 4 hours. ? Cooler with ice packs: 24 hours. ? Refrigerator: 5-8 days, if the milk is clean. It is best to use the milk within 3 days. ? Freezer: 9-12 months, if the milk is clean and stored away from the freezer door. It is best to use the milk within 6 months.  Put milk in the back of the refrigerator or freezer.  Thaw frozen milk using warm water. Do not use the microwave. Tips for choosing  a breast pump When choosing a pump, keep the following things in mind:  Manual breast pumps do not need electricity. They cost less. They can be hard to use.  Electric breast pumps use electricity. They are more expensive. They are easier to use. They collect more milk.  The suction cup (flange) should be the right size.  Before you buy the pump, check if your insurance will pay for it. Tips for caring for a breast pump  Check the manual that came with your pump for cleaning tips.  Clean the pump after you use  it. To do this: ? Wipe down the electrical part. Use a dry cloth or paper towel. Do not put this part in water or in cleaning products. ? Wash the plastic parts with soap and warm water. Or use the dishwasher if the manual says it is safe. You do not need to clean the tubing unless it touched breast milk. ? Let all the parts air dry. Avoid drying them with a cloth or towel. ? When the parts are clean and dry, put the pump back together. Then store the pump.  If there is water in the tubing when you want to pump: 1. Attach the tubing to the pump. 2. Turn on the pump. 3. Turn off the pump when the tube is dry.  Try not to touch the inside of pump parts. Summary  Pumping can help you start making milk after your baby is born. It lets you keep making milk when you are away from your baby.  When you are away from your baby, pump for about 15 minutes every 2-3 hours. Pump both breasts at the same time, if you can. This information is not intended to replace advice given to you by your health care provider. Make sure you discuss any questions you have with your health care provider. Document Revised: 02/28/2019 Document Reviewed: 12/13/2016 Elsevier Patient Education  2020 [employer].  Eating Plan for Breastfeeding Women On the days when a woman is breastfeeding, her body burns more calories than usual (about 400-500 extra calories). It is important that you eat enough calories by consuming a variety of healthy foods. You do not need to follow a special diet while breastfeeding your baby. Focus on making healthy choices to help support and maintain your milk production. What are tips for following this plan? Medicines, vitamins, and supplements  Talk with your health care provider about all medicines that you are taking. Medicines can pass to your baby through your breast milk. Certain medicines may slow, delay, or lessen your milk production and may be harmful to your baby.  Continue to take  your prenatal vitamins and any other supplements as told by your health care provider.  If you follow a special diet (such as a vegan diet) or you have certain health conditions, you may need additional supplements. Talk with your health care provider about this. Amount and variety  Consume enough calories each day to maintain your milk supply.  Drink plenty of water to help maintain your milk supply and stay nourished yourself. Aim for 64 oz (2 L) or more a day, or as much as told by your health care provider. Your urine should be pale yellow in color. If you notice that your urine is dark yellow, drink more water.  Continue to follow a healthy, well-balanced diet that includes healthy snacks. The taste of your milk will be affected by what you eat. Eating different foods will expose  your baby to different tastes, which may help your baby to accept solid foods more easily later on. What to limit or avoid  Limit your overall intake of foods that have "empty calories." These are foods that have little nutritional value, such as sweets, desserts, candies, and sugar-sweetened beverages.  Avoid drinking more than 16-24 oz (473-710 mL) of caffeinated drinks in a day. Caffeine is dehydrating. It will also enter your breast milk, and this might bother your baby or interfere with his or her sleep.  Avoid drinking more than one alcoholic drink per day. Examples of one drink are a 5-oz glass of wine, a 12-oz beer, or 1.5 fluid oz of 80 proof distilled spirits (40% alcohol). It is better for your baby if: ? You wait to have your first alcoholic drink until your breastfeeding has been well established, which is usually after 2-3 months. ? You wait 4 hours or longer to breastfeed after you have an alcoholic drink. As an alternative, you may pump your breast milk before you drink alcohol. You can store that milk to feed to your baby at a later time.  You may notice that certain foods cause you or your baby to  have increased gas and may cause fussiness in your baby. If you notice increased gas or fussiness in your baby when you eat these foods, consider avoiding them while breastfeeding. Food safety  To prevent food-borne illnesses, practice good food safety and cleanliness, such as washing your hands before you eat and after you prepare raw meat. What foods can I eat?  Fruits All fruits. Try to eat a variety of colors and types of fruits to get a full range of vitamins and minerals. Remember to wash your fruits well before eating. Vegetables All vegetables. Try to eat a variety of colors and types of vegetables to get a full range of vitamins and minerals. Remember to wash your vegetables well before eating. Grains All grains. Try to choose whole grains, such as whole-wheat bread, oatmeal, or brown rice. Meats and other protein foods Lean meats. Try to eat chicken, [place], fish, and lean cuts of beef, veal, or pork. If you eat fish or seafood, choose options that are low in mercury, such as salmon, canned light tuna, catfish, shrimp, crab, and lobster. Other good protein sources include tofu, tempeh, beans, eggs, peanut butter, and other nut butters. Dairy Dairy is okay to eat. Continue to drink milk and milk alternatives (such as almond milk), or eat yogurt, cheese, cottage cheese, or sour cream. Beverages Most beverages are okay. Fats and oils All fats and oils are okay. Sweets and desserts All sweets and desserts are okay. Seasoning and other foods All seasonings are okay. The items listed above may not be a complete list of foods and beverages you can eat. Contact a dietitian for more information. What foods are not recommended? Meats and other protein foods Avoid eating fish that have high mercury content, such as tilefish, shark, swordfish, and king mackerel. To learn more about mercury in fish, talk with your health care provider or look for online resources, such  as:  PumpkinSearch.com.ee Beverages Avoid drinking sugar-sweetened beverages such as sodas, teas, or energy drinks. Limit the amount of caffeine and alcohol that you drink. The items listed above may not be a complete list of foods and beverages you should avoid. Contact a dietitian for more information. Where to find more information You can talk with your health care provider or use an online calorie  calculator to determine how many calories you need to eat each day while breastfeeding, such as:  https://ball-collins.biz/ Summary  Focus on making healthy choices to help support and maintain your milk production. To maintain a good milk supply and stay nourished yourself, it is also important that you drink plenty of water.  Talk with your health care provider about all medicines that you are taking. Continue to take your prenatal vitamins and any other supplements as told by your health care provider.  Avoid drinking large amounts of caffeine or alcohol.  Continue to follow a healthy, well-balanced diet that includes healthy snacks. The taste of your milk will be affected by what you eat. Eating different foods will expose your baby to different tastes, which may help your baby to accept solid foods more easily later on. This information is not intended to replace advice given to you by your health care provider. Make sure you discuss any questions you have with your health care provider. Document Revised: 03/29/2019 Document Reviewed: 08/05/2017 Elsevier Patient Education  2020 [employer].  Breastfeeding  Choosing to breastfeed is one of the best decisions you can make for yourself and your baby. A change in hormones during pregnancy causes your breasts to make breast milk in your milk-producing glands. Hormones prevent breast milk from being released before your baby is born. They also prompt milk flow after birth. Once breastfeeding has begun, thoughts of your baby, as well as his or her sucking  or crying, can stimulate the release of milk from your milk-producing glands. Benefits of breastfeeding Research shows that breastfeeding offers many health benefits for infants and mothers. It also offers a cost-free and convenient way to feed your baby. For your baby  Your first milk (colostrum) helps your baby's digestive system to function better.  Special cells in your milk (antibodies) help your baby to fight off infections.  Breastfed babies are less likely to develop asthma, allergies, obesity, or type 2 diabetes. They are also at lower risk for sudden infant death syndrome (SIDS).  Nutrients in breast milk are better able to meet your baby's needs compared to infant formula.  Breast milk improves your baby's brain development. For you  Breastfeeding helps to create a very special bond between you and your baby.  Breastfeeding is convenient. Breast milk costs nothing and is always available at the correct temperature.  Breastfeeding helps to burn calories. It helps you to lose the weight that you gained during pregnancy.  Breastfeeding makes your uterus return faster to its size before pregnancy. It also slows bleeding (lochia) after you give birth.  Breastfeeding helps to lower your risk of developing type 2 diabetes, osteoporosis, rheumatoid arthritis, cardiovascular disease, and breast, ovarian, uterine, and endometrial cancer later in life. Breastfeeding basics Starting breastfeeding  Find a comfortable place to sit or lie down, with your neck and back well-supported.  Place a pillow or a rolled-up blanket under your baby to bring him or her to the level of your breast (if you are seated). Nursing pillows are specially designed to help support your arms and your baby while you breastfeed.  Make sure that your baby's tummy (abdomen) is facing your abdomen.  Gently massage your breast. With your fingertips, massage from the outer edges of your breast inward toward the  nipple. This encourages milk flow. If your milk flows slowly, you may need to continue this action during the feeding.  Support your breast with 4 fingers underneath and your thumb above your  nipple (make the letter "C" with your hand). Make sure your fingers are well away from your nipple and your baby's mouth.  Stroke your baby's lips gently with your finger or nipple.  When your baby's mouth is open wide enough, quickly bring your baby to your breast, placing your entire nipple and as much of the areola as possible into your baby's mouth. The areola is the colored area around your nipple. ? More areola should be visible above your baby's upper lip than below the lower lip. ? Your baby's lips should be opened and extended outward (flanged) to ensure an adequate, comfortable latch. ? Your baby's tongue should be between his or her lower gum and your breast.  Make sure that your baby's mouth is correctly positioned around your nipple (latched). Your baby's lips should create a seal on your breast and be turned out (everted).  It is common for your baby to suck about 2-3 minutes in order to start the flow of breast milk. Latching Teaching your baby how to latch onto your breast properly is very important. An improper latch can cause nipple pain, decreased milk supply, and poor weight gain in your baby. Also, if your baby is not latched onto your nipple properly, he or she may swallow some air during feeding. This can make your baby fussy. Burping your baby when you switch breasts during the feeding can help to get rid of the air. However, teaching your baby to latch on properly is still the best way to prevent fussiness from swallowing air while breastfeeding. Signs that your baby has successfully latched onto your nipple  Silent tugging or silent sucking, without causing you pain. Infant's lips should be extended outward (flanged).  Swallowing heard between every 3-4 sucks once your milk has  started to flow (after your let-down milk reflex occurs).  Muscle movement above and in front of his or her ears while sucking. Signs that your baby has not successfully latched onto your nipple  Sucking sounds or smacking sounds from your baby while breastfeeding.  Nipple pain. If you think your baby has not latched on correctly, slip your finger into the corner of your baby's mouth to break the suction and place it between your baby's gums. Attempt to start breastfeeding again. Signs of successful breastfeeding Signs from your baby  Your baby will gradually decrease the number of sucks or will completely stop sucking.  Your baby will fall asleep.  Your baby's body will relax.  Your baby will retain a small amount of milk in his or her mouth.  Your baby will let go of your breast by himself or herself. Signs from you  Breasts that have increased in firmness, weight, and size 1-3 hours after feeding.  Breasts that are softer immediately after breastfeeding.  Increased milk volume, as well as a change in milk consistency and color by the fifth day of breastfeeding.  Nipples that are not sore, cracked, or bleeding. Signs that your baby is getting enough milk  Wetting at least 1-2 diapers during the first 24 hours after birth.  Wetting at least 5-6 diapers every 24 hours for the first week after birth. The urine should be clear or pale yellow by the age of 5 days.  Wetting 6-8 diapers every 24 hours as your baby continues to grow and develop.  At least 3 stools in a 24-hour period by the age of 5 days. The stool should be soft and yellow.  At least 3 stools  in a 24-hour period by the age of 7 days. The stool should be seedy and yellow.  No loss of weight greater than 10% of birth weight during the first 3 days of life.  Average weight gain of 4-7 oz (113-198 g) per week after the age of 4 days.  Consistent daily weight gain by the age of 5 days, without weight loss after  the age of 2 weeks. After a feeding, your baby may spit up a small amount of milk. This is normal. Breastfeeding frequency and duration Frequent feeding will help you make more milk and can prevent sore nipples and extremely full breasts (breast engorgement). Breastfeed when you feel the need to reduce the fullness of your breasts or when your baby shows signs of hunger. This is called "breastfeeding on demand." Signs that your baby is hungry include:  Increased alertness, activity, or restlessness.  Movement of the head from side to side.  Opening of the mouth when the corner of the mouth or cheek is stroked (rooting).  Increased sucking sounds, smacking lips, cooing, sighing, or squeaking.  Hand-to-mouth movements and sucking on fingers or hands.  Fussing or crying. Avoid introducing a pacifier to your baby in the first 4-6 weeks after your baby is born. After this time, you may choose to use a pacifier. Research has shown that pacifier use during the first year of a baby's life decreases the risk of sudden infant death syndrome (SIDS). Allow your baby to feed on each breast as long as he or she wants. When your baby unlatches or falls asleep while feeding from the first breast, offer the second breast. Because newborns are often sleepy in the first few weeks of life, you may need to awaken your baby to get him or her to feed. Breastfeeding times will vary from baby to baby. However, the following rules can serve as a guide to help you make sure that your baby is properly fed:  Newborns (babies 644 weeks of age or younger) may breastfeed every 1-3 hours.  Newborns should not go without breastfeeding for longer than 3 hours during the day or 5 hours during the night.  You should breastfeed your baby a minimum of 8 times in a 24-hour period. Breast milk pumping     Pumping and storing breast milk allows you to make sure that your baby is exclusively fed your breast milk, even at times when  you are unable to breastfeed. This is especially important if you go back to work while you are still breastfeeding, or if you are not able to be present during feedings. Your lactation consultant can help you find a method of pumping that works best for you and give you guidelines about how long it is safe to store breast milk. Caring for your breasts while you breastfeed Nipples can become dry, cracked, and sore while breastfeeding. The following recommendations can help keep your breasts moisturized and healthy:  Avoid using soap on your nipples.  Wear a supportive bra designed especially for nursing. Avoid wearing underwire-style bras or extremely tight bras (sports bras).  Air-dry your nipples for 3-4 minutes after each feeding.  Use only cotton bra pads to absorb leaked breast milk. Leaking of breast milk between feedings is normal.  Use lanolin on your nipples after breastfeeding. Lanolin helps to maintain your skin's normal moisture barrier. Pure lanolin is not harmful (not toxic) to your baby. You may also hand express a few drops of breast milk and gently  massage that milk into your nipples and allow the milk to air-dry. In the first few weeks after giving birth, some women experience breast engorgement. Engorgement can make your breasts feel heavy, warm, and tender to the touch. Engorgement peaks within 3-5 days after you give birth. The following recommendations can help to ease engorgement:  Completely empty your breasts while breastfeeding or pumping. You may want to start by applying warm, moist heat (in the shower or with warm, water-soaked hand towels) just before feeding or pumping. This increases circulation and helps the milk flow. If your baby does not completely empty your breasts while breastfeeding, pump any extra milk after he or she is finished.  Apply ice packs to your breasts immediately after breastfeeding or pumping, unless this is too uncomfortable for you. To do  this: ? Put ice in a plastic bag. ? Place a towel between your skin and the bag. ? Leave the ice on for 20 minutes, 2-3 times a day.  Make sure that your baby is latched on and positioned properly while breastfeeding. If engorgement persists after 48 hours of following these recommendations, contact your health care provider or a Ad[employer]. Overall health care recommendations while breastfeeding  Eat 3 healthy meals and 3 snacks every day. Well-nourished mothers who are breastfeeding need an additional 450-500 calories a day. You can meet this requirement by increasing the amount of a balanced diet that you eat.  Drink enough water to keep your urine pale yellow or clear.  Rest often, relax, and continue to take your prenatal vitamins to prevent fatigue, stress, and low vitamin and mineral levels in your body (nutrient deficiencies).  Do not use any products that contain nicotine or tobacco, such as cigarettes and e-cigarettes. Your baby may be harmed by chemicals from cigarettes that pass into breast milk and exposure to secondhand smoke. If you need help quitting, ask your health care provider.  Avoid alcohol.  Do not use illegal drugs or marijuana.  Talk with your health care provider before taking any medicines. These include over-the-counter and prescription medicines as well as vitamins and herbal supplements. Some medicines that may be harmful to your baby can pass through breast milk.  It is possible to become pregnant while breastfeeding. If birth control is desired, ask your health care provider about options that will be safe while breastfeeding your baby. Where to find more information: [employer]: www.llli.org Contact a health care provider if:  You feel like you want to stop breastfeeding or have become frustrated with breastfeeding.  Your nipples are cracked or bleeding.  Your breasts are red, tender, or warm.  You have: ? Painful  breasts or nipples. ? A swollen area on either breast. ? A fever or chills. ? Nausea or vomiting. ? Drainage other than breast milk from your nipples.  Your breasts do not become full before feedings by the fifth day after you give birth.  You feel sad and depressed.  Your baby is: ? Too sleepy to eat well. ? Having trouble sleeping. ? More than 49 week old and wetting fewer than 6 diapers in a 24-hour period. ? Not gaining weight by 46 days of age.  Your baby has fewer than 3 stools in a 24-hour period.  Your baby's skin or the white parts of his or her eyes become yellow. Get help right away if:  Your baby is overly tired (lethargic) and does not want to wake up and feed.  Your baby  develops an unexplained fever. Summary  Breastfeeding offers many health benefits for infant and mothers.  Try to breastfeed your infant when he or she shows early signs of hunger.  Gently tickle or stroke your baby's lips with your finger or nipple to allow the baby to open his or her mouth. Bring the baby to your breast. Make sure that much of the areola is in your baby's mouth. Offer one side and burp the baby before you offer the other side.  Talk with your health care provider or lactation consultant if you have questions or you face problems as you breastfeed. This information is not intended to replace advice given to you by your health care provider. Make sure you discuss any questions you have with your health care provider. Document Revised: 02/02/2018 Document Reviewed: 12/10/2016 Elsevier Patient Education  2020 [employer].

## 2020-04-28 NOTE — Progress Notes (Signed)
Meriel Pica CNM notified of 0000 VS as per her request. No new orders received.Marland Kitchen

## 2020-05-12 ENCOUNTER — Other Ambulatory Visit: Payer: Self-pay

## 2020-05-12 ENCOUNTER — Ambulatory Visit (INDEPENDENT_AMBULATORY_CARE_PROVIDER_SITE_OTHER): Payer: BC Managed Care – PPO | Admitting: Obstetrics

## 2020-05-12 ENCOUNTER — Encounter: Payer: Self-pay | Admitting: Obstetrics

## 2020-05-12 DIAGNOSIS — Z8659 Personal history of other mental and behavioral disorders: Secondary | ICD-10-CM

## 2020-05-12 NOTE — Patient Instructions (Signed)

## 2020-05-12 NOTE — Progress Notes (Signed)
S: Angela Nunez is here at 2 weeks PP for a mood check. This is her first baby. Had a vaginal delivery over an itact perineum after a two day long IOL. Her hx is significan t for some anxiety sxs. Today she denies any mood disturbances. Her bleeding is minimal. She is breastfeeding , and this is going well. She has a RX fr the minip[facility] which she may start as early as July 2nd.  She is napping during the day; and has excellent social support at home. O:  Edinburgh score  Is 5 A: 2 weeks PP for mood check P:  Discuss ed her labor and birth and her last two weeks at home. She is doing very well. Plans on using the mini pill for [facility]. Follow up in 4 weeks for her 6 week PP PE. Mirna Mires, CNM  05/12/2020 2:21 PM

## 2020-06-06 ENCOUNTER — Ambulatory Visit (INDEPENDENT_AMBULATORY_CARE_PROVIDER_SITE_OTHER): Payer: BC Managed Care – PPO | Admitting: Obstetrics

## 2020-06-06 ENCOUNTER — Encounter: Payer: Self-pay | Admitting: Obstetrics

## 2020-06-06 ENCOUNTER — Other Ambulatory Visit: Payer: Self-pay

## 2020-06-06 NOTE — Progress Notes (Signed)
Postpartum Visit  Chief Complaint:  Chief Complaint  Patient presents with  . Postpartum Care    no concerns    History of Present Illness: Patient is a 29 y.o. G1P1001 presents for postpartum visit.   Review the Delivery Report for details.  Delivery Note At 6:49 PM a viable female was delivered via Vaginal, Spontaneous (Presentation:  OA  ).  APGAR: 6, 8; weight 7 lb 1.9 oz (3230 g).   Placenta status: Spontaneous, Intact.  Cord: 3 vessels with the following complications: None.  Cord pH: na  Anesthesia: Epidural Episiotomy: None Lacerations: None Suture Repair: na Est. Blood Loss (mL): 200  Mom to postpartum.  Baby to Couplet care / Skin to Skin.  Angela Nunez 06/06/2020, 2:01 PM    Flowsheet data - This SmartLink gives one or more specific piece(s) of flowsheet data.  Usage - DELFLOWMOM[FlowsheetRecordIDs:Time:HideLabel:MyData:Disciplines:FiledOnly  This SmartLink has the following user-entered parameters: 1. FlowsheetRecordIDs - (Required) A comma-separated list of the flowsheet row/group IDs you want to see data for. 2. Time - Determines which time column to find data from. You may enter "LAST" for the last filed data, "FIRST" to see the first filed data, or a specific time such as "0700" entered in 24-hour format. Entering a specific time will show both filed and pended values regardless of what the [place] parameter is set to. The default value is "LAST". 3. HideLabel - Determines whether or not row labels are displayed. Leave this parameter blank or set it to "0" to display the row labels. Set this parameter to anything other than "0" to hide the labels. This parameter is ignored if a flowsheet group ID or more than one flowsheet row ID is listed in the first user-entered parameter. 4. MyData - Determines whose data is pulled in. Set this parameter to "1" to pull in data from providers that have the same discipline as the current user. Enter "2" to pull in  only the current user's documentation. Parameter 4 and 5 are cumulative, so data appears if it meets a condition in either parameter. However, if parameter 4 is set to "0" or left blank and the current user's discipline is not one specified in parameter 5, the current user's data is excluded. 5. Disciplines - A comma-separated list of provider discipline record IDs. Only pulls in data documented by the providers with disciplines listed in this parameter. 6. Mal Misty - Determines whether or not pended data is displayed. Set this parameter to "1" if you wish to hide pended data. If parameter 2 is set to a fixed time, pended data is included regardless of this setting.  Examples: .DELFLOWMOM[1 outputs the entry from flowsheet group 1. .DELFLOWMOM[5:LAST outputs the latest entry for flowsheet row 5. .DELFLOWMOM[5:FIRST outputs the earliest entry for flowsheet row 5. .DELFLOWMOM[5,6:0800 outputs the flowsheet entries for rows 5 and 6 at 8 AM. .DELFLOWMOM[5::1 outputs the flowsheet entry for row 5 without a label and linebreak. .DELFLOWMOM[5:LAST::1 outputs the latest entry for row 5 from a provider with the same discipline as you. Marland KitchenDELFLOWMOM[5:FIRST::2 outputs the earliest entry for row 5 from the current user. .DELFLOWMOM[5:LAST:::1 outputs the latest entry for row 5 from a provider with a discipline of 1. .DELFLOWMOM[5:FIRST::1:1 outputs the earliest flowsheet entry for row 5 from any provider with the discipline of the current user or any provider with a discipline of 1. .DELFLOWMOM[1:::::1 outputs the last filed entry from flowsheet group 1.   Flowsheet data - This SmartLink gives one or  more specific piece(s) of flowsheet data.  Usage - DELFLOWMOM[FlowsheetRecordIDs:Time:HideLabel:MyData:Disciplines:FiledOnly  This SmartLink has the following user-entered parameters: 1. FlowsheetRecordIDs - (Required) A comma-separated list of the flowsheet row/group IDs you want to see data for. 2. Time -  Determines which time column to find data from. You may enter "LAST" for the last filed data, "FIRST" to see the first filed data, or a specific time such as "0700" entered in 24-hour format. Entering a specific time will show both filed and pended values regardless of what the [place] parameter is set to. The default value is "LAST". 3. HideLabel - Determines whether or not row labels are displayed. Leave this parameter blank or set it to "0" to display the row labels. Set this parameter to anything other than "0" to hide the labels. This parameter is ignored if a flowsheet group ID or more than one flowsheet row ID is listed in the first user-entered parameter. 4. MyData - Determines whose data is pulled in. Set this parameter to "1" to pull in data from providers that have the same discipline as the current user. Enter "2" to pull in only the current user's documentation. Parameter 4 and 5 are cumulative, so data appears if it meets a condition in either parameter. However, if parameter 4 is set to "0" or left blank and the current user's discipline is not one specified in parameter 5, the current user's data is excluded. 5. Disciplines - A comma-separated list of provider discipline record IDs. Only pulls in data documented by the providers with disciplines listed in this parameter. 6. Mal Misty - Determines whether or not pended data is displayed. Set this parameter to "1" if you wish to hide pended data. If parameter 2 is set to a fixed time, pended data is included regardless of this setting.  Examples: .DELFLOWMOM[1 outputs the entry from flowsheet group 1. .DELFLOWMOM[5:LAST outputs the latest entry for flowsheet row 5. .DELFLOWMOM[5:FIRST outputs the earliest entry for flowsheet row 5. .DELFLOWMOM[5,6:0800 outputs the flowsheet entries for rows 5 and 6 at 8 AM. .DELFLOWMOM[5::1 outputs the flowsheet entry for row 5 without a label and linebreak. .DELFLOWMOM[5:LAST::1 outputs the latest entry  for row 5 from a provider with the same discipline as you. Marland KitchenDELFLOWMOM[5:FIRST::2 outputs the earliest entry for row 5 from the current user. .DELFLOWMOM[5:LAST:::1 outputs the latest entry for row 5 from a provider with a discipline of 1. .DELFLOWMOM[5:FIRST::1:1 outputs the earliest flowsheet entry for row 5 from any provider with the discipline of the current user or any provider with a discipline of 1. .DELFLOWMOM[1:::::1 outputs the last filed entry from flowsheet group 1.   Date of delivery: 04/27/2020 Type of delivery: Vaginal delivery - Vacuum or forceps assisted  no Episiotomy No.  Laceration: no  Pregnancy or labor problems:  no Any problems since the delivery:  no  Newborn Details:  SINGLETON :  1. BabyGender female. Birth weight:   Maternal Details:  Breast or formula feeding: plans to breastfeed Intercourse: No  Contraception after delivery: Yes  Any bowel or bladder issues: No  Post partum depression/anxiety noted:  no Edinburgh Post-Partum Depression Score:1 Date of last PAP: 2020  no abnormalities   Review of Systems: Review of Systems  Constitutional: Negative.   Eyes: Negative.   Respiratory: Negative.   Cardiovascular: Negative.   Gastrointestinal: Negative.   Genitourinary: Negative.   Skin: Negative.   Neurological: Negative.   Endo/Heme/Allergies: Negative.   Psychiatric/Behavioral: Negative.   All other systems reviewed and are negative.   The  following portions of the patient's history were reviewed and updated as appropriate: current medications, past family history, past medical history, past social history, past surgical history and problem list.  Past Medical History:  Past Medical History:  Diagnosis Date  . Hypothyroidism   . Vaccine for human papilloma virus (HPV) types 6, 11, 16, and 18 administered     Past Surgical History:  Past Surgical History:  Procedure Laterality Date  . WISDOM TOOTH EXTRACTION     x4 extracted     Family  History:  Family History  Problem Relation Age of Onset  . Breast cancer Other     Social History:  Social History   Socioeconomic History  . Marital status: Married    Spouse name: Not on file  . Number of children: Not on file  . Years of education: Not on file  . Highest education level: Not on file  Occupational History  . Occupation: Airline pilotAccountant  Tobacco Use  . Smoking status: Never Smoker  . Smokeless tobacco: Never Used  Vaping Use  . Vaping Use: Never used  Substance and Sexual Activity  . Alcohol use: Not Currently    Comment: Once or twice a month  . Drug use: No  . Sexual activity: Not Currently    Birth control/protection: None  Other Topics Concern  . Not on file  Social History Narrative  . Not on file   Social Determinants of Health   Financial Resource Strain:   . Difficulty of Paying Living Expenses:   Food Insecurity:   . Worried About Programme researcher, broadcasting/film/videounning Out of Food in the Last Year:   . Baristaan Out of Food in the Last Year:   Transportation Needs:   . Freight forwarderLack of Transportation (Medical):   Marland Kitchen. Lack of Transportation (Non-Medical):   Physical Activity:   . Days of Exercise per Week:   . Minutes of Exercise per Session:   Stress:   . Feeling of Stress :   Social Connections:   . Frequency of Communication with Friends and Family:   . Frequency of Social Gatherings with Friends and Family:   . Attends Religious Services:   . Active Member of Clubs or Organizations:   . Attends BankerClub or Organization Meetings:   Marland Kitchen. Marital Status:   Intimate Partner Violence:   . Fear of Current or Ex-Partner:   . Emotionally Abused:   Marland Kitchen. Physically Abused:   . Sexually Abused:     Allergies:  No Known Allergies  Medications: Prior to Admission medications   Medication Sig Start Date End Date Taking? Authorizing Provider  acetaminophen (TYLENOL) 325 MG tablet Take 2 tablets (650 mg total) by mouth every 4 (four) hours as needed (for pain scale < 4). Patient not taking:  Reported on 05/12/2020 04/28/20   Farrel ConnersGutierrez, Colleen, CNM  augmented betamethasone dipropionate (DIPROLENE-AF) 0.05 % ointment  02/12/17   [provider]  hydrOXYzine (ATARAX/VISTARIL) 25 MG tablet Take 25 mg by mouth 3 (three) times daily as needed. Patient not taking: Reported on 05/12/2020    [provider]  ibuprofen (ADVIL) 600 MG tablet Take 1 tablet (600 mg total) by mouth every 6 (six) hours as needed. Patient not taking: Reported on 05/12/2020 04/28/20   Farrel ConnersGutierrez, Colleen, CNM  norethindrone (ERRIN) 0.35 MG tablet Take 1 tablet (0.35 mg total) by mouth daily. Patient not taking: Reported on 05/12/2020 05/25/20   Farrel ConnersGutierrez, Colleen, CNM  Prenatal Vit-Fe Fumarate-FA (PRENATAL VITAMIN PO) Take by mouth. Patient not taking: Reported on  05/12/2020    [provider]    Physical Exam Blood pressure 120/90, height 5\' 1"  (1.549 m), weight 185 lb (83.9 kg), last menstrual period 07/19/2019, currently breastfeeding.    General: NAD HEENT: normocephalic, anicteric Pulmonary: No increased work of breathing Abdomen: NABS, soft, non-tender, non-distended.  Umbilicus without lesions.  No hepatomegaly, splenomegaly or masses palpable. No evidence of hernia. Genitourinary:  External: Normal external female genitalia.  Normal urethral meatus, normal  Bartholin's and Skene's glands.    Vagina: Normal vaginal mucosa, no evidence of prolapse.    Cervix: Grossly normal in appearance, no bleeding  Uterus: Non-enlarged, mobile, normal contour.  No CMT  Adnexa: ovaries non-enlarged, no adnexal masses  Rectal: deferred Extremities: no edema, erythema, or tenderness Neurologic: Grossly intact Psychiatric: mood appropriate, affect full   Edinburgh Postnatal Depression Scale - 06/06/20 1358      Edinburgh Postnatal Depression Scale:  In the Past 7 Days   I have been able to laugh and see the funny side of things. 0    I have looked forward with enjoyment to things. 0    I have  blamed myself unnecessarily when things went wrong. 0    I have been anxious or worried for no good reason. 0    I have felt scared or panicky for no good reason. 0    Things have been getting on top of me. 0    I have been so unhappy that I have had difficulty sleeping. 0    I have felt sad or miserable. 0    I have been so unhappy that I have been crying. 0    The thought of harming myself has occurred to me. 0    Edinburgh Postnatal Depression Scale Total 0           Assessment: 29 y.o. G1P1001 presenting for 6 week postpartum visit  Plan: Problem List Items Addressed This Visit      Other   Postpartum care and examination of lactating mother   Postpartum care following vaginal delivery - Primary       1) Contraception - Education given regarding options for contraception, as well as compatibility with breast feeding if applicable.  Patient plans on oral progesterone-only contraceptive for contraception.  2)  Pap - ASCCP guidelines and rational discussed.  ASCCP guidelines and rational discussed.  Patient opts for yearly screening interval  3) Patient underwent screening for postpartum depression with no signs of depression  4) Return in about 1 year (around 06/06/2021) for annual gyn exam.   06/08/2021, MD, [facility], [facility] 06/06/2020, 2:01 PM

## 2020-08-04 ENCOUNTER — Telehealth: Payer: Self-pay | Admitting: Obstetrics and Gynecology

## 2020-08-04 NOTE — Telephone Encounter (Signed)
Patient is scheduled for paragurad placement with [facility] on 08/06/20 at 10:50. Patient reports date of last cycle 08/01/20 completed on Sunday 08/03/20 please advise

## 2020-08-05 NOTE — Telephone Encounter (Signed)
Noted. Paragard to arrive today. Will reserve for this patient.

## 2020-08-05 NOTE — Telephone Encounter (Signed)
That's fine

## 2020-08-06 ENCOUNTER — Encounter: Payer: Self-pay | Admitting: Obstetrics and Gynecology

## 2020-08-06 ENCOUNTER — Ambulatory Visit (INDEPENDENT_AMBULATORY_CARE_PROVIDER_SITE_OTHER): Payer: BC Managed Care – PPO | Admitting: Obstetrics and Gynecology

## 2020-08-06 ENCOUNTER — Other Ambulatory Visit: Payer: Self-pay

## 2020-08-06 VITALS — BP 110/80 | Ht 61.0 in | Wt 182.0 lb

## 2020-08-06 DIAGNOSIS — Z30430 Encounter for insertion of intrauterine contraceptive device: Secondary | ICD-10-CM

## 2020-08-06 MED ORDER — LEVONORGESTREL 20 MCG/24HR IU IUD: 1.0000 | INTRAUTERINE_SYSTEM | Freq: Once | INTRAUTERINE | 0 refills | Status: AC

## 2020-08-06 NOTE — Patient Instructions (Signed)
I value your feedback and entrusting us with your care. If you get a [place] patient survey, I would appreciate you taking the time to let us know about your experience today. Thank you!  As of November 01, 2019, your lab results will be released to your MyChart immediately, before I even have a chance to see them. Please give me time to review them and contact you if there are any abnormalities. Thank you for your patience.   [facility] [phone]  Instructions after IUD insertion  Most women experience no significant problems after insertion of an IUD, however minor cramping and spotting for a few days is common. Cramps may be treated with ibuprofen 800mg every 8 hours or Tylenol 650 mg every 4 hours. Contact [facility] immediately if you experience any of the following symptoms during the next week: temperature >99.6 degrees, worsening pelvic pain, abdominal pain, fainting, unusually heavy vaginal bleeding, foul vaginal discharge, or if you think you have expelled the IUD.  Nothing inserted in the vagina for 48 hours. You will be scheduled for a follow up visit in approximately four weeks.  You should check monthly to be sure you can feel the IUD strings in the upper vagina. If you are having a monthly period, try to check after each period. If you cannot feel the IUD strings,  contact [facility] immediately so we can do an exam to determine if the IUD has been expelled.   Please use backup protection until we can confirm the IUD is in place.  Call [facility] if you are exposed to or diagnosed with a sexually transmitted infection, as we will need to discuss whether it is safe for you to continue using an IUD.   

## 2020-08-06 NOTE — Progress Notes (Signed)
   Chief Complaint  Patient presents with  . Contraception    Paragard insertion      IUD PROCEDURE NOTE:  Angela Nunez is a 29 y.o. G1P1001 here forMirena  IUD insertion for BC. Pt is PP and breastfeeding, currently on POPs. Was considering paragard but didn't realize she could have mirena and still BF. No side effects with hormones in past.    BP 110/80   Ht 5\' 1"  (1.549 m)   Wt 182 lb (82.6 kg)   LMP 08/01/2020 (Exact Date)   Breastfeeding Yes   BMI 34.39 kg/m   IUD Insertion Procedure Note Patient identified, informed consent performed, consent signed.   Discussed risks of irregular bleeding, cramping, infection, malpositioning or misplacement of the IUD outside the uterus which may require further procedure such as laparoscopy, risk of failure <1%. Time out was performed.    Speculum placed in the vagina.  Cervix visualized.  Cleaned with Betadine x 2.  Grasped anteriorly with a single tooth tenaculum.  Uterus sounded to 7.5 cm.   IUD placed per manufacturer's recommendations.  Strings trimmed to 3 cm. Tenaculum was removed, good hemostasis noted.  Patient tolerated procedure well.   ASSESSMENT:  Encounter for insertion of intrauterine contraceptive device (IUD) - Plan: levonorgestrel ([name]) 20 MCG/24HR IUD   Meds ordered this encounter  Medications  . levonorgestrel ([name]) 20 MCG/24HR IUD    Sig: 1 Intra Uterine Device (1 each total) by Intrauterine route once for 1 dose.    Dispense:  1 each    Refill:  0    Order Specific Question:   Supervising Provider    Answer:   10/01/2020 Nadara Mustard     Plan:  Patient was given post-procedure instructions.  She was advised to have backup contraception for one week.   Call if you are having increasing pain, cramps or bleeding or if you have a fever greater than 100.4 degrees F., shaking chills, nausea or vomiting. Patient was also asked to check IUD strings periodically and follow up in 4 weeks for IUD  check.  Return in about 4 weeks (around 09/03/2020) for IUD f/u.  Angela Whitacre B. Dorrien Grunder, PA-C 08/06/2020 11:14 AM

## 2020-09-05 ENCOUNTER — Other Ambulatory Visit: Payer: Self-pay

## 2020-09-05 ENCOUNTER — Encounter: Payer: Self-pay | Admitting: Obstetrics and Gynecology

## 2020-09-05 ENCOUNTER — Ambulatory Visit (INDEPENDENT_AMBULATORY_CARE_PROVIDER_SITE_OTHER): Payer: BC Managed Care – PPO | Admitting: Obstetrics and Gynecology

## 2020-09-05 VITALS — BP 106/80 | Ht 61.0 in | Wt 183.0 lb

## 2020-09-05 DIAGNOSIS — Z30431 Encounter for routine checking of intrauterine contraceptive device: Secondary | ICD-10-CM

## 2020-09-05 NOTE — Patient Instructions (Signed)
I value your feedback and entrusting us with your care. If you get a [facility] patient survey, I would appreciate you taking the time to let us know about your experience today. Thank you!  As of November 01, 2019, your lab results will be released to your MyChart immediately, before I even have a chance to see them. Please give me time to review them and contact you if there are any abnormalities. Thank you for your patience.  

## 2020-09-05 NOTE — Progress Notes (Signed)
° °  Chief Complaint  Patient presents with   IUD check     History of Present Illness:  JENISHA FAISON is a 29 y.o. that had a [name] IUD placed approximately 1 month ago. Since that time, she denies dyspareunia, pelvic pain, vaginal d/c, heavy bleeding. Had 1 wk light spotting/bleeding. Doing well.   Review of Systems  Constitutional: Negative for fever.  Gastrointestinal: Negative for blood in stool, constipation, diarrhea, nausea and vomiting.  Genitourinary: Negative for dyspareunia, dysuria, flank pain, frequency, hematuria, urgency, vaginal bleeding, vaginal discharge and vaginal pain.  Musculoskeletal: Negative for back pain.  Skin: Negative for rash.    Physical Exam:  BP 106/80    Ht 5\' 1"  (1.549 m)    Wt 183 lb (83 kg)    Breastfeeding Yes    BMI 34.58 kg/m  Body mass index is 34.58 kg/m.  Pelvic exam:  Two IUD strings present seen coming from the cervical os. EGBUS, vaginal vault and cervix: within normal limits   Assessment:   Encounter for routine checking of intrauterine contraceptive device (IUD)  IUD strings present in proper location; pt doing well  Plan: F/u if any signs of infection or can no longer feel the strings.   Yuritza Paulhus B. Ranard Harte, PA-C 09/05/2020 2:21 PM

## 2021-06-04 ENCOUNTER — Encounter: Payer: Self-pay | Admitting: Physician Assistant

## 2021-07-28 ENCOUNTER — Telehealth: Payer: Self-pay | Admitting: Physician Assistant

## 2021-07-28 ENCOUNTER — Encounter: Payer: Self-pay | Admitting: Family Medicine

## 2021-07-28 DIAGNOSIS — F419 Anxiety disorder, unspecified: Secondary | ICD-10-CM

## 2021-07-28 MED ORDER — HYDROXYZINE HCL 25 MG PO TABS: 25.0000 mg | ORAL_TABLET | Freq: Three times a day (TID) | ORAL | 3 refills | Status: DC | PRN

## 2021-07-28 NOTE — Telephone Encounter (Signed)
Attempted to reach the office 3 times, Patient is in need of medication refill for anxiety

## 2021-07-31 ENCOUNTER — Ambulatory Visit: Payer: BC Managed Care – PPO | Admitting: Family Medicine

## 2021-07-31 ENCOUNTER — Other Ambulatory Visit: Payer: Self-pay

## 2021-07-31 ENCOUNTER — Encounter: Payer: Self-pay | Admitting: Family Medicine

## 2021-07-31 VITALS — BP 148/99 | HR 101 | Temp 97.8°F | Resp 16 | Wt 178.8 lb

## 2021-07-31 DIAGNOSIS — R5383 Other fatigue: Secondary | ICD-10-CM

## 2021-07-31 DIAGNOSIS — F418 Other specified anxiety disorders: Secondary | ICD-10-CM

## 2021-07-31 NOTE — Assessment & Plan Note (Signed)
Due to spouse being out of the house at night for work

## 2021-07-31 NOTE — Progress Notes (Signed)
Established patient visit   Patient: Angela Nunez   DOB: [birth year]   30 y.o. Female  MRN: [id number] Visit Date: 07/31/2021  Today's healthcare provider: Jacky Kindle, FNP   Chief Complaint  Patient presents with   Anxiety   Subjective    HPI  Anxiety, Follow-up  She was last seen for anxiety 30  months ago. Changes made at last visit include started patient on Hydroxyzine 25mg .   She reports fair compliance with treatment.  Has not taken recently- was taking only 12.5 mg at a time. She reports good tolerance of treatment. She is not having side effects.   She feels her anxiety is moderate and Unchanged since last visit. Patient states that her husband is starting back night shift, for 2 weeks, and she has a 57 month old at home and is anxious with him leaving- despite having safe neighborhood and a dog at home.  Symptoms: No chest pain No difficulty concentrating  No dizziness No fatigue  No feelings of losing control No insomnia  No irritable No palpitations  No panic attacks No racing thoughts  No shortness of breath No sweating  No tremors/shakes    GAD-7 Results No flowsheet data found.  PHQ-9 Scores PHQ9 SCORE ONLY 07/31/2021 02/22/2020 01/10/2019  PHQ-9 Total Score 6 0 4    ---------------------------------------------------------------------------------------------------   Medications: Outpatient Medications Prior to Visit  Medication Sig   hydrOXYzine (ATARAX/VISTARIL) 25 MG tablet Take 1 tablet (25 mg total) by mouth 3 (three) times daily as needed for anxiety.   levonorgestrel ([name]) 20 MCG/24HR IUD 1 Intra Uterine Device (1 each total) by Intrauterine route once for 1 dose.   [DISCONTINUED] augmented betamethasone dipropionate (DIPROLENE-AF) 0.05 % ointment    No facility-administered medications prior to visit.    Review of Systems     Objective    BP (!) 148/99   Pulse (!) 101   Temp 97.8 F (36.6 C) (Oral)   Resp 16   Wt 178 lb  12.8 oz (81.1 kg)   SpO2 98%   Breastfeeding No   BMI 33.78 kg/m  {Show previous vital signs (optional):23777}  Physical Exam Vitals and nursing note reviewed.  Constitutional:      General: She is not in acute distress.    Appearance: Normal appearance. She is obese. She is not ill-appearing, toxic-appearing or diaphoretic.  HENT:     Head: Normocephalic and atraumatic.  Cardiovascular:     Rate and Rhythm: Regular rhythm. Tachycardia present.     Pulses: Normal pulses.     Heart sounds: Normal heart sounds. No murmur heard.   No friction rub. No gallop.  Pulmonary:     Effort: Pulmonary effort is normal. No respiratory distress.     Breath sounds: Normal breath sounds. No stridor. No wheezing, rhonchi or rales.  Chest:     Chest wall: No tenderness.  Abdominal:     General: Bowel sounds are normal.     Palpations: Abdomen is soft.  Musculoskeletal:        General: No swelling, tenderness, deformity or signs of injury. Normal range of motion.     Right lower leg: No edema.     Left lower leg: No edema.  Skin:    General: Skin is warm and dry.     Capillary Refill: Capillary refill takes less than 2 seconds.     Coloration: Skin is not jaundiced or pale.     Findings: No bruising, erythema,  lesion or rash.  Neurological:     General: No focal deficit present.     Mental Status: She is alert and oriented to person, place, and time. Mental status is at baseline.     Cranial Nerves: No cranial nerve deficit.     Sensory: No sensory deficit.     Motor: No weakness.     Coordination: Coordination normal.  Psychiatric:        Attention and Perception: Attention and perception normal.        Mood and Affect: Affect normal. Mood is anxious.        Speech: Speech normal.        Behavior: Behavior normal.        Thought Content: Thought content normal.        Cognition and Memory: Cognition normal.        Judgment: Judgment normal.     No results found for any visits on  07/31/21.  Assessment & Plan     Problem List Items Addressed This Visit       Other   Situational anxiety - Primary   Fatigue   Relevant Orders   CBC with Differential/Platelet   Comprehensive metabolic panel   Hemoglobin A1c   VITAMIN D 25 Hydroxy (Vit-D Deficiency, Fractures)   Vitamin B12   TSH   T4, free   Lipid panel   Magnesium     Return in about 6 months (around 01/28/2022) for annual examination.      Leilani Merl, FNP, have reviewed all documentation for this visit. The documentation on 07/31/21 for the exam, diagnosis, procedures, and orders are all accurate and complete.    Jacky Kindle, FNP  [facility] [phone] (phone) [phone] (fax)  [facility]

## 2021-07-31 NOTE — Assessment & Plan Note (Signed)
Noted on GAD- reports going to sleep around 7:30 pm Daughter sleeps all night

## 2021-08-01 LAB — COMPREHENSIVE METABOLIC PANEL
ALT: 11 IU/L (ref 0–32)
AST: 15 IU/L (ref 0–40)
Albumin/Globulin Ratio: 2 (ref 1.2–2.2)
Albumin: 4.8 g/dL (ref 3.9–5.0)
Alkaline Phosphatase: 77 IU/L (ref 44–121)
BUN/Creatinine Ratio: 13 (ref 9–23)
BUN: 9 mg/dL (ref 6–20)
Bilirubin Total: 0.6 mg/dL (ref 0.0–1.2)
CO2: 23 mmol/L (ref 20–29)
Calcium: 10 mg/dL (ref 8.7–10.2)
Chloride: 102 mmol/L (ref 96–106)
Creatinine, Ser: 0.72 mg/dL (ref 0.57–1.00)
Globulin, Total: 2.4 g/dL (ref 1.5–4.5)
Glucose: 94 mg/dL (ref 65–99)
Potassium: 4.7 mmol/L (ref 3.5–5.2)
Sodium: 139 mmol/L (ref 134–144)
Total Protein: 7.2 g/dL (ref 6.0–8.5)
eGFR: 117 mL/min/{1.73_m2} (ref >59)

## 2021-08-01 LAB — CBC WITH DIFFERENTIAL/PLATELET
Basophils Absolute: 0.1 10*3/uL (ref 0.0–0.2)
Basos: 1 %
EOS (ABSOLUTE): 0.3 10*3/uL (ref 0.0–0.4)
Eos: 3 %
Hematocrit: 45.6 % (ref 34.0–46.6)
Hemoglobin: 15 g/dL (ref 11.1–15.9)
Immature Grans (Abs): 0 10*3/uL (ref 0.0–0.1)
Immature Granulocytes: 0 %
Lymphocytes Absolute: 2.6 10*3/uL (ref 0.7–3.1)
Lymphs: 32 %
MCH: 28.3 pg (ref 26.6–33.0)
MCHC: 32.9 g/dL (ref 31.5–35.7)
MCV: 86 fL (ref 79–97)
Monocytes Absolute: 0.4 10*3/uL (ref 0.1–0.9)
Monocytes: 5 %
Neutrophils Absolute: 4.8 10*3/uL (ref 1.4–7.0)
Neutrophils: 59 %
Platelets: 243 10*3/uL (ref 150–450)
RBC: 5.3 x10E6/uL — ABNORMAL HIGH (ref 3.77–5.28)
RDW: 12.3 % (ref 11.7–15.4)
WBC: 8.2 10*3/uL (ref 3.4–10.8)

## 2021-08-01 LAB — LIPID PANEL
Chol/HDL Ratio: 5.1 ratio — ABNORMAL HIGH (ref 0.0–4.4)
Cholesterol, Total: 193 mg/dL (ref 100–199)
HDL: 38 mg/dL — ABNORMAL LOW (ref >39)
LDL Chol Calc (NIH): 111 mg/dL — ABNORMAL HIGH (ref 0–99)
Triglycerides: 253 mg/dL — ABNORMAL HIGH (ref 0–149)
VLDL Cholesterol Cal: 44 mg/dL — ABNORMAL HIGH (ref 5–40)

## 2021-08-01 LAB — HEMOGLOBIN A1C
Est. average glucose Bld gHb Est-mCnc: 103 mg/dL
Hgb A1c MFr Bld: 5.2 % (ref 4.8–5.6)

## 2021-08-01 LAB — VITAMIN D 25 HYDROXY (VIT D DEFICIENCY, FRACTURES): Vit D, 25-Hydroxy: 20.8 ng/mL — ABNORMAL LOW (ref 30.0–100.0)

## 2021-08-01 LAB — T4, FREE: Free T4: 1.21 ng/dL (ref 0.82–1.77)

## 2021-08-01 LAB — TSH: TSH: 1.52 u[IU]/mL (ref 0.450–4.500)

## 2021-08-01 LAB — VITAMIN B12: Vitamin B-12: 562 pg/mL (ref 232–1245)

## 2021-08-01 LAB — MAGNESIUM: Magnesium: 1.9 mg/dL (ref 1.6–2.3)

## 2021-08-03 ENCOUNTER — Encounter: Payer: Self-pay | Admitting: Family Medicine

## 2021-08-04 ENCOUNTER — Other Ambulatory Visit: Payer: Self-pay | Admitting: Family Medicine

## 2021-08-04 DIAGNOSIS — R7989 Other specified abnormal findings of blood chemistry: Secondary | ICD-10-CM

## 2021-08-04 MED ORDER — VITAMIN D (ERGOCALCIFEROL) 1.25 MG (50000 UNIT) PO CAPS: 50000.0000 [IU] | ORAL_CAPSULE | ORAL | 3 refills | Status: DC

## 2021-09-16 NOTE — Telephone Encounter (Signed)
Patient decided [name]. [name] rcvd/charged 08/06/20

## 2021-12-11 ENCOUNTER — Ambulatory Visit: Payer: Self-pay | Admitting: *Deleted

## 2021-12-11 NOTE — Telephone Encounter (Signed)
Summary: covid positive   Pt is covid positive as of 01/19, throat hurts, chest congestion, sinus pressure, fever 101 last night 100.7 today. Seeking clinical advice       Chief Complaint: covid positive requesting advise , appt  Symptoms: sore throat, cough, congestion, sinus pressure, fever 100.7 Frequency: since 12/10/21 tested positive 12/10/21 Pertinent Negatives: Patient denies difficulty breathing SOB, productive cough  Disposition: _0 ED /_1 Urgent Care (no appt availability in office) / _2 Appointment(In office/virtual)/ _3  [facility]/ _4 Home Care/ _5 Refused Recommended Disposition /_6 [facility]/ _7  Follow-up with PCP Additional Notes:  Recommended to treat sx at home mild at this time. Reviewed My Chart VV or telehealth visit if needed.        Reason for Disposition  [1] COVID-19 diagnosed by positive lab test (e.g., PCR, rapid self-test kit) AND [2] mild symptoms (e.g., cough, fever, others) AND [7] no complications or SOB  Answer Assessment - Initial Assessment Questions 1. COVID-19 DIAGNOSIS: "Who made your COVID-19 diagnosis?" "Was it confirmed by a positive lab test or self-test?" If not diagnosed by a doctor (or NP/PA), ask "Are there lots of cases (community spread) where you live?" Note: See public health department website, if unsure.     Covid positive at home test  2. COVID-19 EXPOSURE: "Was there any known exposure to COVID before the symptoms began?" CDC Definition of close contact: within 6 feet (2 meters) for a total of 15 minutes or more over a 24-hour period.      At work  3. ONSET: "When did the COVID-19 symptoms start?"      12/10/21 4. WORST SYMPTOM: "What is your worst symptom?" (e.g., cough, fever, shortness of breath, muscle aches)     Cough, fever, chest congestion ,  5. COUGH: "Do you have a cough?" If Yes, ask: "How bad is the cough?"       Cough mild  6. FEVER: "Do you have a fever?" If Yes, ask: "What is your temperature, how  was it measured, and when did it start?"     Yes  7. RESPIRATORY STATUS: "Describe your breathing?" (e.g., shortness of breath, wheezing, unable to speak)      Na  8. BETTER-SAME-WORSE: "Are you getting better, staying the same or getting worse compared to yesterday?"  If getting worse, ask, "In what way?"     na 9. HIGH RISK DISEASE: "Do you have any chronic medical problems?" (e.g., asthma, heart or lung disease, weak immune system, obesity, etc.)     na 10. VACCINE: "Have you had the COVID-19 vaccine?" If Yes, ask: "Which one, how many shots, when did you get it?"       X 2 Pfizer  11. BOOSTER: "Have you received your COVID-19 booster?" If Yes, ask: "Which one and when did you get it?"       na 12. PREGNANCY: "Is there any chance you are pregnant?" "When was your last menstrual period?"       na 13. OTHER SYMPTOMS: "Do you have any other symptoms?"  (e.g., chills, fatigue, headache, loss of smell or taste, muscle pain, sore throat)       Sinus pressure, chest congestion, fever, sore throat  14. O2 SATURATION MONITOR:  "Do you use an oxygen saturation monitor (pulse oximeter) at home?" If Yes, ask "What is your reading (oxygen level) today?" "What is your usual oxygen saturation reading?" (e.g., 95%)       na  Protocols used: Coronavirus (COVID-19) Diagnosed or Suspected-A-AH

## 2021-12-11 NOTE — Telephone Encounter (Signed)
Patient was advised.  

## 2021-12-11 NOTE — Telephone Encounter (Signed)
Please advise 

## 2021-12-13 ENCOUNTER — Telehealth: Payer: BC Managed Care – PPO | Admitting: Nurse Practitioner

## 2021-12-13 DIAGNOSIS — U071 COVID-19: Secondary | ICD-10-CM | POA: Diagnosis not present

## 2021-12-13 MED ORDER — MOLNUPIRAVIR EUA 200MG CAPSULE: 4.0000 | ORAL_CAPSULE | Freq: Two times a day (BID) | ORAL | 0 refills | Status: AC

## 2021-12-13 MED ORDER — FLUTICASONE PROPIONATE 50 MCG/ACT NA SUSP: 2.0000 | Freq: Every day | NASAL | 0 refills | Status: DC

## 2021-12-13 NOTE — Patient Instructions (Signed)
Angela Nunez, thank you for joining Claiborne Rigg, NP for today's virtual visit.  While this provider is not your primary care provider (PCP), if your PCP is located in our provider database this encounter information will be shared with them immediately following your visit.  Consent: (Patient) Angela Nunez provided verbal consent for this virtual visit at the beginning of the encounter.  Current Medications:  Current Outpatient Medications:    fluticasone (FLONASE) 50 MCG/ACT nasal spray, Place 2 sprays into both nostrils daily., Disp: 16 g, Rfl: 0   molnupiravir EUA (LAGEVRIO) 200 mg CAPS capsule, Take 4 capsules (800 mg total) by mouth 2 (two) times daily for 5 days., Disp: 40 capsule, Rfl: 0   hydrOXYzine (ATARAX/VISTARIL) 25 MG tablet, Take 1 tablet (25 mg total) by mouth 3 (three) times daily as needed for anxiety., Disp: 90 tablet, Rfl: 3   levonorgestrel ([name]) 20 MCG/24HR IUD, 1 Intra Uterine Device (1 each total) by Intrauterine route once for 1 dose., Disp: 1 each, Rfl: 0   Vitamin D, Ergocalciferol, (DRISDOL) 1.25 MG (50000 UNIT) CAPS capsule, Take 1 capsule (50,000 Units total) by mouth every 7 (seven) days., Disp: 5 capsule, Rfl: 3   Medications ordered in this encounter:  Meds ordered this encounter  Medications   molnupiravir EUA (LAGEVRIO) 200 mg CAPS capsule    Sig: Take 4 capsules (800 mg total) by mouth 2 (two) times daily for 5 days.    Dispense:  40 capsule    Refill:  0    Order Specific Question:   Supervising Provider    Answer:   MILLER, BRIAN [[id number]]   fluticasone (FLONASE) 50 MCG/ACT nasal spray    Sig: Place 2 sprays into both nostrils daily.    Dispense:  16 g    Refill:  0    Order Specific Question:   Supervising Provider    Answer:   Hyacinth Meeker, BRIAN [[id number]]     *If you need refills on other medications prior to your next appointment, please contact your pharmacy*  Follow-Up: Call back or seek an in-person evaluation if the symptoms worsen or  if the condition fails to improve as anticipated.  Other Instructions Please keep well-hydrated and get plenty of rest. Start a saline nasal rinse to flush out your nasal passages. You can use plain Mucinex to help thin congestion. If you have a humidifier, running in the bedroom at night. I want you to start OTC vitamin D3 1000 units daily, vitamin C 1000 mg daily, and a zinc supplement. Please take prescribed medications as directed.   You have been enrolled in a MyChart symptom monitoring program. Please answer these questions daily so we can keep track of how you are doing.   If PCR test positive, please see quarantine instructions below. I also want you to message me via MyChart, using the separate message I have sent you. This way you can communicate directly with me.    You were to quarantine for 5 days from onset of your symptoms.  After day 5, if you have had no fever and you are feeling better, you can end quarantine but need to mask for an additional 5 days. After day 5 if you have a fever or are having significant symptoms, please quarantine for full 10 days.   If you note any worsening of symptoms, any significant shortness of breath or any chest pain, please seek ER evaluation ASAP.  Please do not delay care!    If  you have been instructed to have an in-person evaluation today at a local Urgent Care facility, please use the link below. It will take you to a list of all of our available [facility], including address, phone number and hours of operation. Please do not delay care.  [facility]  If you or a family member do not have a primary care provider, use the link below to schedule a visit and establish care. When you choose a [facility] primary care physician or advanced practice provider, you gain a long-term partner in health. Find a Primary Care Provider  Learn more about [facility]'s in-office and virtual care options: [facility] - Get  Care Now

## 2021-12-13 NOTE — Progress Notes (Signed)
Virtual Visit Consent   [name], you are scheduled for a virtual visit with a [facility] provider today.     Just as with appointments in the office, your consent must be obtained to participate.  Your consent will be active for this visit and any virtual visit you may have with one of our providers in the next 365 days.     If you have a MyChart account, a copy of this consent can be sent to you electronically.  All virtual visits are billed to your insurance company just like a traditional visit in the office.    As this is a virtual visit, video technology does not allow for your provider to perform a traditional examination.  This may limit your provider's ability to fully assess your condition.  If your provider identifies any concerns that need to be evaluated in person or the need to arrange testing (such as labs, EKG, etc.), we will make arrangements to do so.     Although advances in technology are sophisticated, we cannot ensure that it will always work on either your end or our end.  If the connection with a video visit is poor, the visit may have to be switched to a telephone visit.  With either a video or telephone visit, we are not always able to ensure that we have a secure connection.     I need to obtain your verbal consent now.   Are you willing to proceed with your visit today?    KWANISHA SEUFERT has provided verbal consent on 12/13/2021 for a virtual visit (video or telephone).   Claiborne Rigg, NP   Date: 12/13/2021 9:27 AM   Virtual Visit via Video Note   I, Claiborne Rigg, connected with  Angela PUSTEJOVSKY  ([id number], Nov 28, 1990) on 12/13/21 at  9:15 AM EST by a video-enabled telemedicine application and verified that I am speaking with the correct person using two identifiers.  Location: Patient: Virtual Visit Location Patient: Home Provider: Virtual Visit Location Provider: Home Office   I discussed the limitations of evaluation and management by  telemedicine and the availability of in person appointments. The patient expressed understanding and agreed to proceed.    History of Present Illness: Angela Nunez is a 31 y.o. who identifies as a female who Nunez assigned female at birth, and is being seen today for COVID POSITIVE Home test. Tested psotive for COVID 4 days ago. Since that time symptoms have been persistent. She notes fever (tmax today 99), sinus and nasal Congestion, pharyngitis, bilateral ear pain and pressure, vocal hoarseness and dry cough.  Taking OTC analgesics at this time.   Problems:  Patient Active Problem List   Diagnosis Date Noted   Fatigue 07/31/2021   Situational anxiety 01/10/2019    Allergies: No Known Allergies Medications:  Current Outpatient Medications:    fluticasone (FLONASE) 50 MCG/ACT nasal spray, Place 2 sprays into both nostrils daily., Disp: 16 g, Rfl: 0   molnupiravir EUA (LAGEVRIO) 200 mg CAPS capsule, Take 4 capsules (800 mg total) by mouth 2 (two) times daily for 5 days., Disp: 40 capsule, Rfl: 0   hydrOXYzine (ATARAX/VISTARIL) 25 MG tablet, Take 1 tablet (25 mg total) by mouth 3 (three) times daily as needed for anxiety., Disp: 90 tablet, Rfl: 3   levonorgestrel ([name]) 20 MCG/24HR IUD, 1 Intra Uterine Device (1 each total) by Intrauterine route once for 1 dose., Disp: 1 each, Rfl: 0   Vitamin D,  Ergocalciferol, (DRISDOL) 1.25 MG (50000 UNIT) CAPS capsule, Take 1 capsule (50,000 Units total) by mouth every 7 (seven) days., Disp: 5 capsule, Rfl: 3  Observations/Objective: Patient is well-developed, well-nourished in no acute distress.  Resting comfortably at home.  Head is normocephalic, atraumatic.  No labored breathing.  Speech is clear and coherent with logical content.  Patient is alert and oriented at baseline.    Assessment and Plan: 1. Positive self-administered antigen test for COVID-19 - molnupiravir EUA (LAGEVRIO) 200 mg CAPS capsule; Take 4 capsules (800 mg total) by mouth  2 (two) times daily for 5 days.  Dispense: 40 capsule; Refill: 0 - fluticasone (FLONASE) 50 MCG/ACT nasal spray; Place 2 sprays into both nostrils daily.  Dispense: 16 g; Refill: 0 /INSTRUCTIONS: use a humidifier for nasal congestion Drink plenty of fluids, rest and wash hands frequently to avoid the spread of infection Alternate tylenol and Motrin for relief of fever   Follow Up Instructions: I discussed the assessment and treatment plan with the patient. The patient Nunez provided an opportunity to ask questions and all were answered. The patient agreed with the plan and demonstrated an understanding of the instructions.  A copy of instructions were sent to the patient via MyChart unless otherwise noted below.  The patient Nunez advised to call back or seek an in-person evaluation if the symptoms worsen or if the condition fails to improve as anticipated.  Time:  I spent 12 minutes with the patient via telehealth technology discussing the above problems/concerns.    Gildardo Pounds, NP

## 2021-12-14 ENCOUNTER — Encounter: Payer: Self-pay | Admitting: Nurse Practitioner

## 2021-12-15 ENCOUNTER — Encounter: Payer: Self-pay | Admitting: Physician Assistant

## 2021-12-28 NOTE — Progress Notes (Signed)
I,[name],acting as a Education administrator for [employer], PA-C.,have documented all relevant documentation on the behalf of Mikey Kirschner, PA-C,as directed by  Mikey Kirschner, PA-C while in the presence of Mikey Kirschner, PA-C.   Complete physical exam   Patient: Angela Nunez   DOB: [birth year]   30 y.o. Female  MRN: [id number] Visit Date: 12/29/2021  Today's healthcare provider: Mikey Kirschner, PA-C   Chief Complaint  Patient presents with   Annual Exam   Subjective    Angela Nunez is a 31 y.o. female who presents today for a complete physical exam.  She reports consuming a general diet. The patient does not participate in regular exercise at present. She generally feels well. She reports sleeping well. She does not have additional problems to discuss today.   Past Medical History:  Diagnosis Date   Hypothyroidism    Vaccine for human papilloma virus (HPV) types 6, 11, 16, and 18 administered    Past Surgical History:  Procedure Laterality Date   WISDOM TOOTH EXTRACTION     x4 extracted    Social History   Socioeconomic History   Marital status: Married    Spouse name: Not on file   Number of children: Not on file   Years of education: Not on file   Highest education level: Not on file  Occupational History   Occupation: Accountant  Tobacco Use   Smoking status: Never   Smokeless tobacco: Never  Vaping Use   Vaping Use: Never used  Substance and Sexual Activity   Alcohol use: Not Currently    Comment: Once or twice a month   Drug use: No   Sexual activity: Yes    Birth control/protection: I.U.D.    Comment: [name]  Other Topics Concern   Not on file  Social History Narrative   Not on file   Social Determinants of Health   Financial Resource Strain: Not on file  Food Insecurity: Not on file  Transportation Needs: Not on file  Physical Activity: Not on file  Stress: Not on file  Social Connections: Not on file  Intimate Partner Violence: Not on  file   Family Status  Relation Name Status   Mother  Alive   Father  Alive   Daughter  Alive   PGM  Alive   Other [facility] Alive   Family History  Problem Relation Age of Onset   Breast cancer Paternal Grandmother    Breast cancer Other    No Known Allergies  Patient Care Team: Mikey Kirschner, PA-C as PCP - General (Physician Assistant)   Medications: Outpatient Medications Prior to Visit  Medication Sig   hydrOXYzine (ATARAX/VISTARIL) 25 MG tablet Take 1 tablet (25 mg total) by mouth 3 (three) times daily as needed for anxiety.   levonorgestrel ([name]) 20 MCG/24HR IUD 1 Intra Uterine Device (1 each total) by Intrauterine route once for 1 dose.   Vitamin D, Ergocalciferol, (DRISDOL) 1.25 MG (50000 UNIT) CAPS capsule Take 1 capsule (50,000 Units total) by mouth every 7 (seven) days. (Patient not taking: Reported on 12/29/2021)   [DISCONTINUED] fluticasone (FLONASE) 50 MCG/ACT nasal spray Place 2 sprays into both nostrils daily. (Patient not taking: Reported on 12/29/2021)   No facility-administered medications prior to visit.    Review of Systems  Constitutional: Negative.   Eyes: Negative.   Cardiovascular: Negative.   Gastrointestinal: Negative.   Endocrine: Negative.   Genitourinary: Negative.   Musculoskeletal: Negative.   Skin: Negative.   Allergic/Immunologic: Negative.  Neurological: Negative.   Hematological: Negative.   Psychiatric/Behavioral: Negative.    All other systems reviewed and are negative.    Objective    Blood pressure 136/90, pulse 85, height 5' 1"  (1.549 m), weight 183 lb 9.6 oz (83.3 kg), SpO2 100 %, not currently breastfeeding.   Physical Exam Constitutional:      General: She is awake.     Appearance: She is well-developed.  HENT:     Head: Normocephalic.     Right Ear: Tympanic membrane normal.     Left Ear: Tympanic membrane normal.  Eyes:     Conjunctiva/sclera: Conjunctivae normal.     Pupils: Pupils are equal, round, and reactive to  light.  Neck:     Thyroid: No thyroid mass or thyromegaly.  Cardiovascular:     Rate and Rhythm: Normal rate and regular rhythm.     Heart sounds: Normal heart sounds.  Pulmonary:     Effort: Pulmonary effort is normal.     Breath sounds: Normal breath sounds.  Abdominal:     Palpations: Abdomen is soft.     Tenderness: There is no abdominal tenderness.  Genitourinary:    Pubic Area: No rash.      Labia:        Right: No rash.        Left: No rash.   Musculoskeletal:     Right lower leg: No swelling.     Left lower leg: No swelling.  Lymphadenopathy:     Cervical: No cervical adenopathy.  Skin:    General: Skin is warm.  Neurological:     Mental Status: She is alert and oriented to person, place, and time.  Psychiatric:        Attention and Perception: Attention normal.        Mood and Affect: Mood normal.        Speech: Speech normal.        Behavior: Behavior is cooperative.      Last depression screening scores PHQ 2/9 Scores 12/29/2021 07/31/2021 02/22/2020  PHQ - 2 Score 0 2 0  PHQ- 9 Score 0 6 0   Last fall risk screening Fall Risk  12/29/2021  Falls in the past year? 0  Number falls in past yr: 0  Injury with Fall? 0  Risk for fall due to : No Fall Risks   Last Audit-C alcohol use screening Alcohol Use Disorder Test (AUDIT) 12/29/2021  1. How often do you have a drink containing alcohol? 1  2. How many drinks containing alcohol do you have on a typical day when you are drinking? 0  3. How often do you have six or more drinks on one occasion? 0  AUDIT-C Score 1  Alcohol Brief Interventions/Follow-up -   A score of 3 or more in women, and 4 or more in men indicates increased risk for alcohol abuse, EXCEPT if all of the points are from question 1   No results found for any visits on 12/29/21.  Assessment & Plan    Routine Health Maintenance and Physical Exam  Immunization History  Administered Date(s) Administered   DTaP 07/06/1993, 09/17/1993, 03/31/1994,  05/09/1995, 07/17/1997   HPV 9-valent 07/08/2006, 05/03/2007, 10/13/2007   Hepatitis A 07/08/2006, 05/03/2007   Hepatitis B Sep [birth year], 1993, 07/06/1993, 04/02/1994   HiB (PRP-OMP) 07/06/1993, 09/17/1993, 03/31/1994   IPV 07/06/1993, 09/17/1993, 03/31/1994, 05/09/1995, 07/17/1997   Influenza,inj,Quad PF,6+ Mos 08/29/2019   Influenza-Unspecified 09/03/2015, 11/03/2016, 08/24/2021   MMR 05/07/1995, 07/17/1997   Meningococcal Conjugate  06/27/2008   Meningococcal Mcv4o 08/30/2011   PFIZER(Purple Top)SARS-COV-2 Vaccination 08/03/2020, 08/24/2020   Td 07/08/2006, 07/01/2017   Tdap 07/08/2006, 02/26/2020   Varicella 08/30/2011    Health Maintenance  Topic Date Due   Hepatitis C Screening  Never done   PAP-Cervical Cytology Screening  01/18/2021   PAP SMEAR-Modifier  01/18/2021   COVID-19 Vaccine (3 - Booster for Pfizer series) 01/14/2022 (Originally 10/19/2020)   TETANUS/TDAP  02/25/2030   INFLUENZA VACCINE  Completed   HPV VACCINES  Completed   HIV Screening  Completed    Discussed health benefits of physical activity, and encouraged her to engage in regular exercise appropriate for her age and condition.  Problem List Items Addressed This Visit       Other   Situational anxiety    Situational to being home alone at night, bad weather. Feels comfortable with 12.5 mg of hydroxyzine prn.        Avitaminosis D    Pt did not take supplement, but takes daily multivitamin w/ d Will recheck level and decide if high dose supplement is necessary.      Relevant Orders   Vitamin D (25 hydroxy)   Other Visit Diagnoses     Health maintenance examination    -  Primary   Relevant Orders   Cytology - PAP   Lipid Profile   Well woman exam       Relevant Orders   Cytology - PAP   Encounter for hepatitis C screening test for low risk patient       Relevant Orders   Hepatitis C antibody   Moderate mixed hyperlipidemia not requiring statin therapy       Relevant Orders   Lipid Profile         Return in about 1 year (around 12/29/2022) for CPE.     I, Mikey Kirschner, PA-C have reviewed all documentation for this visit. The documentation on  12/29/2021  for the exam, diagnosis, procedures, and orders are all accurate and complete.    Mikey Kirschner, PA-C  [facility] [phone] (phone) [phone] (fax)  [place]

## 2021-12-29 ENCOUNTER — Other Ambulatory Visit: Payer: Self-pay

## 2021-12-29 ENCOUNTER — Encounter: Payer: Self-pay | Admitting: Physician Assistant

## 2021-12-29 ENCOUNTER — Ambulatory Visit (INDEPENDENT_AMBULATORY_CARE_PROVIDER_SITE_OTHER): Payer: BC Managed Care – PPO | Admitting: Physician Assistant

## 2021-12-29 ENCOUNTER — Encounter: Payer: BC Managed Care – PPO | Admitting: Family Medicine

## 2021-12-29 ENCOUNTER — Other Ambulatory Visit (HOSPITAL_COMMUNITY)
Admission: RE | Admit: 2021-12-29 | Discharge: 2021-12-29 | Disposition: A | Discharge status: Home / Self Care | Payer: BC Managed Care – PPO | Source: Ambulatory Visit | Attending: Family Medicine | Admitting: Family Medicine

## 2021-12-29 VITALS — BP 136/90 | HR 85 | Ht 61.0 in | Wt 183.6 lb

## 2021-12-29 DIAGNOSIS — Z01419 Encounter for gynecological examination (general) (routine) without abnormal findings: Secondary | ICD-10-CM

## 2021-12-29 DIAGNOSIS — E782 Mixed hyperlipidemia: Secondary | ICD-10-CM

## 2021-12-29 DIAGNOSIS — Z0000 Encounter for general adult medical examination without abnormal findings: Secondary | ICD-10-CM | POA: Insufficient documentation

## 2021-12-29 DIAGNOSIS — Z124 Encounter for screening for malignant neoplasm of cervix: Secondary | ICD-10-CM

## 2021-12-29 DIAGNOSIS — F418 Other specified anxiety disorders: Secondary | ICD-10-CM

## 2021-12-29 DIAGNOSIS — Z1159 Encounter for screening for other viral diseases: Secondary | ICD-10-CM | POA: Diagnosis not present

## 2021-12-29 DIAGNOSIS — E559 Vitamin D deficiency, unspecified: Secondary | ICD-10-CM

## 2021-12-29 NOTE — Assessment & Plan Note (Signed)
Pt did not take supplement, but takes daily multivitamin w/ d Will recheck level and decide if high dose supplement is necessary.

## 2021-12-29 NOTE — Assessment & Plan Note (Signed)
Situational to being home alone at night, bad weather. Feels comfortable with 12.5 mg of hydroxyzine prn.

## 2021-12-30 LAB — HEPATITIS C ANTIBODY: Hep C Virus Ab: <0.1 s/co ratio (ref 0.0–0.9)

## 2021-12-30 LAB — LIPID PANEL
Chol/HDL Ratio: 4.7 ratio — ABNORMAL HIGH (ref 0.0–4.4)
Cholesterol, Total: 198 mg/dL (ref 100–199)
HDL: 42 mg/dL (ref >39)
LDL Chol Calc (NIH): 129 mg/dL — ABNORMAL HIGH (ref 0–99)
Triglycerides: 150 mg/dL — ABNORMAL HIGH (ref 0–149)
VLDL Cholesterol Cal: 27 mg/dL (ref 5–40)

## 2021-12-30 LAB — VITAMIN D 25 HYDROXY (VIT D DEFICIENCY, FRACTURES): Vit D, 25-Hydroxy: 21.9 ng/mL — ABNORMAL LOW (ref 30.0–100.0)

## 2021-12-31 ENCOUNTER — Encounter: Payer: Self-pay | Admitting: Physician Assistant

## 2022-01-01 ENCOUNTER — Other Ambulatory Visit: Payer: Self-pay

## 2022-01-01 DIAGNOSIS — E559 Vitamin D deficiency, unspecified: Secondary | ICD-10-CM

## 2022-01-01 LAB — CYTOLOGY - PAP: Diagnosis: NEGATIVE

## 2022-05-11 ENCOUNTER — Encounter: Payer: Self-pay | Admitting: Physician Assistant

## 2022-07-12 ENCOUNTER — Encounter: Payer: Self-pay | Admitting: Family Medicine

## 2022-07-12 ENCOUNTER — Ambulatory Visit: Payer: Self-pay

## 2022-07-12 ENCOUNTER — Ambulatory Visit: Payer: BC Managed Care – PPO | Admitting: Family Medicine

## 2022-07-12 VITALS — BP 131/81 | HR 101 | Temp 97.8°F | Ht 61.0 in | Wt 184.3 lb

## 2022-07-12 DIAGNOSIS — R82998 Other abnormal findings in urine: Secondary | ICD-10-CM | POA: Diagnosis not present

## 2022-07-12 DIAGNOSIS — E559 Vitamin D deficiency, unspecified: Secondary | ICD-10-CM | POA: Diagnosis not present

## 2022-07-12 DIAGNOSIS — R109 Unspecified abdominal pain: Secondary | ICD-10-CM

## 2022-07-12 DIAGNOSIS — R1011 Right upper quadrant pain: Secondary | ICD-10-CM

## 2022-07-12 LAB — POCT URINALYSIS DIPSTICK
Bilirubin, UA: NEGATIVE
Blood, UA: NEGATIVE
Glucose, UA: NEGATIVE
Ketones, UA: NEGATIVE
Nitrite, UA: NEGATIVE
Protein, UA: NEGATIVE
Urobilinogen, UA: 0.2 E.U./dL
pH, UA: 6 (ref 5.0–8.0)

## 2022-07-12 NOTE — Telephone Encounter (Signed)
  Chief Complaint: ruq abd pain, and back pain Symptoms: nausea, pain after eating Frequency: Friday Pertinent Negatives: Patient denies vomiting urination pain, fever Disposition: [] ED /[] Urgent Care (no appt availability in office) / [x] Appointment(In office/virtual)/ []  [facility]/ [] Home Care/ [] Refused Recommended Disposition /[] [facility]/ []  Follow-up with PCP Additional Notes: Appt with in office this am- was seen at [facility]  Reason for Disposition  [1] MILD pain (e.g., does not interfere with normal activities) AND [2] pain comes and goes (cramps) AND [3] present > 48 hours  (Exception: This same abdominal pain is a chronic symptom recurrent or ongoing AND present > 4 weeks.)  Answer Assessment - Initial Assessment Questions 1. LOCATION: "Where does it hurt?"      Right upper quadrant  2. RADIATION: "Does the pain shoot anywhere else?" (e.g., chest, back)     Same on the back 3. ONSET: "When did the pain begin?" (e.g., minutes, hours or days ago)      Friday 4. SUDDEN: "Gradual or sudden onset?"     Gradual  5. PATTERN "Does the pain come and go, or is it constant?"    - If it comes and goes: "How long does it last?" "Do you have pain now?"     (Note: Comes and goes means the pain is intermittent. It goes away completely between bouts.)    - If constant: "Is it getting better, staying the same, or getting worse?"      (Note: Constant means the pain never goes away completely; most serious pain is constant and gets worse.)      *No Answer* 6. SEVERITY: "How bad is the pain?"  (e.g., Scale 1-10; mild, moderate, or severe)    - MILD (1-3): Doesn't interfere with normal activities, abdomen soft and not tender to touch.     - MODERATE (4-7): Interferes with normal activities or awakens from sleep, abdomen tender to touch.     - SEVERE (8-10): Excruciating pain, doubled over, unable to do any normal activities.       none 7. RECURRENT SYMPTOM: "Have you ever had  this type of stomach pain before?" If Yes, ask: "When was the last time?" and "What happened that time?"      nausea 8. CAUSE: "What do you think is causing the stomach pain?"     Gall bladder 9. RELIEVING/AGGRAVATING FACTORS: "What makes it better or worse?" (e.g., antacids, bending or twisting motion, bowel movement)     Eating nausea 10. OTHER SYMPTOMS: "Do you have any other symptoms?" (e.g., back pain, diarrhea, fever, urination pain, vomiting)       Back pain 11. PREGNANCY: "Is there any chance you are pregnant?" "When was your last menstrual period?"       No -LMP: July  Protocols used: Abdominal Pain - [facility]

## 2022-07-12 NOTE — Progress Notes (Signed)
I,Angela Nunez,acting as a Neurosurgeon for Angela Merry, MD.,have documented all relevant documentation on the behalf of Angela Merry, MD,as directed by  Angela Merry, MD while in the presence of Angela Merry, MD.   Established patient visit   Patient: Angela Nunez   DOB: 03/30/1991   31 y.o. Female  MRN: [id number] Visit Date: 07/12/2022  Today's healthcare provider: Mila Merry, MD   No chief complaint on file.  Subjective    Abdominal Pain This is a new problem. The current episode started in the past 7 days (3 days ago). The onset quality is gradual. The problem occurs constantly. The pain is located in the RUQ and right flank. The pain is at a severity of 8/10. The quality of the pain is cramping (like a pulled muscle). The abdominal pain radiates to the back. Associated symptoms include constipation, nausea and vomiting. The pain is aggravated by certain positions. The pain is relieved by Nothing. Treatments tried: pepto bismol. The treatment provided no relief.   Occurred three nights ago, lasting from 5pm to 3 am, had another episode episode 2 nights ago. Went to Urgent Care yesterday and put on bland diet, and had no symptoms over night last night. No labs were done. Symptoms resolved today. No change in bowels.    Medications: Outpatient Medications Prior to Visit  Medication Sig   hydrOXYzine (ATARAX/VISTARIL) 25 MG tablet Take 1 tablet (25 mg total) by mouth 3 (three) times daily as needed for anxiety.   Vitamin D, Ergocalciferol, (DRISDOL) 1.25 MG (50000 UNIT) CAPS capsule Take 1 capsule (50,000 Units total) by mouth every 7 (seven) days.   levonorgestrel ([name]) 20 MCG/24HR IUD 1 Intra Uterine Device (1 each total) by Intrauterine route once for 1 dose.   No facility-administered medications prior to visit.    Review of Systems  Gastrointestinal:  Positive for abdominal pain, constipation, nausea and vomiting.  Musculoskeletal:  Positive for back pain.        Objective    BP 131/81 (BP Location: Right Arm, Patient Position: Sitting, Cuff Size: Normal)   Pulse (!) 101   Temp 97.8 F (36.6 C) (Oral)   Ht 5\' 1"  (1.549 m)   Wt 184 lb 4.8 oz (83.6 kg)   SpO2 99%   BMI 34.82 kg/m    Physical Exam  General Appearance:    Overweight female, alert, cooperative, in no acute distress  Eyes:    PERRL, conjunctiva/corneas clear, EOM's intact       Lungs:     Clear to auscultation bilaterally, respirations unlabored  Heart:    Tachycardic. Normal rhythm. No murmurs, rubs, or gallops.    Abdomen:   Slight RUQ discomfort with deep palpation. No epigastric or LUQ tenderness.  bowel sounds present and normal in all 4 quadrants, soft, and nondistended. No CVA tenderness         Results for orders placed or performed in visit on 07/12/22  POCT Urinalysis Dipstick  Result Value Ref Range   Color, UA     Clarity, UA     Glucose, UA Negative Negative   Bilirubin, UA Negative    Ketones, UA Negative    Spec Grav, UA     Blood, UA Negative    pH, UA 6.0 5.0 - 8.0   Protein, UA Negative Negative   Urobilinogen, UA 0.2 0.2 or 1.0 E.U./dL   Nitrite, UA Negative    Leukocytes, UA Moderate (2+) (A) Negative   Appearance  Odor      Assessment & Plan     1. RUQ pain Most consistent with gallbladder disease. Symptoms resolved today.  - CBC - Comprehensive metabolic panel - US Abdomen Limited RUQ (LIVER/GB); Future  2. Flank pain  - Urine Culture  3. Leukocytes in urine No symptoms of lower urinary tract infection. Likely incidental finding.  - Urine Culture  4. Avitaminosis D Due to check  VITAMIN D 25 Hydroxy (Vit-D Deficiency, Fractures)      The entirety of the information documented in the History of Present Illness, Review of Systems and Physical Exam were personally obtained by me. Portions of this information were initially documented by the CMA and reviewed by me for thoroughness and accuracy.     Angela Merry, MD   [facility] [phone] (phone) [phone] (fax)  [facility]

## 2022-07-13 LAB — COMPREHENSIVE METABOLIC PANEL
ALT: 11 IU/L (ref 0–32)
AST: 15 IU/L (ref 0–40)
Albumin/Globulin Ratio: 1.7 (ref 1.2–2.2)
Albumin: 4.8 g/dL (ref 4.0–5.0)
Alkaline Phosphatase: 78 IU/L (ref 44–121)
BUN/Creatinine Ratio: 19 (ref 9–23)
BUN: 14 mg/dL (ref 6–20)
Bilirubin Total: 1.1 mg/dL (ref 0.0–1.2)
CO2: 21 mmol/L (ref 20–29)
Calcium: 10.1 mg/dL (ref 8.7–10.2)
Chloride: 102 mmol/L (ref 96–106)
Creatinine, Ser: 0.74 mg/dL (ref 0.57–1.00)
Globulin, Total: 2.9 g/dL (ref 1.5–4.5)
Glucose: 79 mg/dL (ref 70–99)
Potassium: 4.8 mmol/L (ref 3.5–5.2)
Sodium: 138 mmol/L (ref 134–144)
Total Protein: 7.7 g/dL (ref 6.0–8.5)
eGFR: 112 mL/min/{1.73_m2} (ref >59)

## 2022-07-13 LAB — CBC
Hematocrit: 43.8 % (ref 34.0–46.6)
Hemoglobin: 15.3 g/dL (ref 11.1–15.9)
MCH: 29.4 pg (ref 26.6–33.0)
MCHC: 34.9 g/dL (ref 31.5–35.7)
MCV: 84 fL (ref 79–97)
Platelets: 235 10*3/uL (ref 150–450)
RBC: 5.2 x10E6/uL (ref 3.77–5.28)
RDW: 12.5 % (ref 11.7–15.4)
WBC: 9.8 10*3/uL (ref 3.4–10.8)

## 2022-07-13 LAB — VITAMIN D 25 HYDROXY (VIT D DEFICIENCY, FRACTURES): Vit D, 25-Hydroxy: 26.1 ng/mL — ABNORMAL LOW (ref 30.0–100.0)

## 2022-07-14 LAB — URINE CULTURE

## 2022-07-15 ENCOUNTER — Encounter: Payer: Self-pay | Admitting: Family Medicine

## 2022-07-19 ENCOUNTER — Ambulatory Visit
Admission: RE | Admit: 2022-07-19 | Discharge: 2022-07-19 | Disposition: A | Discharge status: Home / Self Care | Payer: BC Managed Care – PPO | Source: Ambulatory Visit | Attending: Family Medicine | Admitting: Family Medicine

## 2022-07-19 DIAGNOSIS — R1011 Right upper quadrant pain: Secondary | ICD-10-CM

## 2022-07-20 ENCOUNTER — Encounter: Payer: Self-pay | Admitting: Family Medicine

## 2022-07-20 DIAGNOSIS — K8020 Calculus of gallbladder without cholecystitis without obstruction: Secondary | ICD-10-CM | POA: Insufficient documentation

## 2022-07-20 DIAGNOSIS — K828 Other specified diseases of gallbladder: Secondary | ICD-10-CM | POA: Insufficient documentation

## 2022-07-20 DIAGNOSIS — R1011 Right upper quadrant pain: Secondary | ICD-10-CM | POA: Insufficient documentation

## 2022-07-22 ENCOUNTER — Telehealth: Payer: Self-pay

## 2022-07-22 DIAGNOSIS — K828 Other specified diseases of gallbladder: Secondary | ICD-10-CM

## 2022-07-22 DIAGNOSIS — K8020 Calculus of gallbladder without cholecystitis without obstruction: Secondary | ICD-10-CM

## 2022-07-22 DIAGNOSIS — R1011 Right upper quadrant pain: Secondary | ICD-10-CM

## 2022-07-22 NOTE — Telephone Encounter (Signed)
Copied from CRM [id number]. Topic: Referral - Request for Referral >> Jul 22, 2022  4:20 PM Franchot Heidelberg wrote: Pt wants to have her referral sent to [facility] clinic, the patient spoke to [facility] there. She says that she can be seen a lot sooner at their facility.  Best contact: [phone]

## 2022-07-23 HISTORY — PX: CHOLECYSTECTOMY: SHX55

## 2022-07-27 ENCOUNTER — Other Ambulatory Visit: Payer: Self-pay | Admitting: Physician Assistant

## 2022-07-27 DIAGNOSIS — K8020 Calculus of gallbladder without cholecystitis without obstruction: Secondary | ICD-10-CM

## 2022-07-27 NOTE — Telephone Encounter (Signed)
Pt is calling to have referral changed from [facility] to [facility] clinic. Pt could be seen sooner for REF27 - AMB REFERRAL TO GENERAL SURGERY Cb- [phone] Requesting referral to be fax- [phone] [name] Pt reports that her insurance goes out the middle of next week. Trying to complete ASAP

## 2022-07-27 NOTE — Addendum Note (Signed)
Addended by: Malva Limes on: 07/27/2022 01:57 PM   Modules accepted: Orders

## 2022-07-27 NOTE — Telephone Encounter (Signed)
Copied from CRM [id number]. Topic: Referral - Question >> Jul 27, 2022  3:26 PM Everette C wrote: Reason for CRM: Shawna Orleans with [facility] has called to share that  Dr. Lemar Livings is no longer working with patients with gallstones  Shawna Orleans would like to know if it is okay for the patient to be seen by another provider within the facility for their gallstone concerns   Dr Tonna Boehringer (who is available tomorrow 07/28/22) and as well as Dr. Hazle Quant are available to see the patient   Please contact further when possible   Shawna Orleans has requested a call back to confirm that the patient is okay to be seen by another provider and stresses the urgency of the request so that the patient may potentially be seen tomorrow

## 2022-07-29 ENCOUNTER — Ambulatory Visit
Admission: RE | Admit: 2022-07-29 | Discharge: 2022-07-29 | Disposition: A | Discharge status: Home / Self Care | Payer: BC Managed Care – PPO | Attending: Surgery | Admitting: Surgery

## 2022-07-29 ENCOUNTER — Ambulatory Visit: Payer: BC Managed Care – PPO | Admitting: Anesthesiology

## 2022-07-29 ENCOUNTER — Other Ambulatory Visit: Payer: Self-pay

## 2022-07-29 ENCOUNTER — Encounter: Payer: Self-pay | Admitting: Surgery

## 2022-07-29 ENCOUNTER — Encounter
Admission: RE | Disposition: A | Discharge status: Home / Self Care | Payer: Self-pay | Source: Home / Self Care | Attending: Surgery

## 2022-07-29 DIAGNOSIS — E039 Hypothyroidism, unspecified: Secondary | ICD-10-CM | POA: Insufficient documentation

## 2022-07-29 DIAGNOSIS — K8010 Calculus of gallbladder with chronic cholecystitis without obstruction: Secondary | ICD-10-CM | POA: Diagnosis not present

## 2022-07-29 DIAGNOSIS — K828 Other specified diseases of gallbladder: Secondary | ICD-10-CM

## 2022-07-29 DIAGNOSIS — K8020 Calculus of gallbladder without cholecystitis without obstruction: Secondary | ICD-10-CM

## 2022-07-29 DIAGNOSIS — F419 Anxiety disorder, unspecified: Secondary | ICD-10-CM | POA: Diagnosis not present

## 2022-07-29 DIAGNOSIS — I10 Essential (primary) hypertension: Secondary | ICD-10-CM | POA: Insufficient documentation

## 2022-07-29 LAB — POCT PREGNANCY, URINE: Preg Test, Ur: NEGATIVE

## 2022-07-29 SURGERY — CHOLECYSTECTOMY, ROBOT-ASSISTED, LAPAROSCOPIC
Anesthesia: General | Site: Abdomen

## 2022-07-29 MED ORDER — PROMETHAZINE HCL 25 MG/ML IJ SOLN
INTRAMUSCULAR | Status: AC
  Filled 2022-07-29: qty 1

## 2022-07-29 MED ORDER — MIDAZOLAM HCL 2 MG/2ML IJ SOLN
INTRAMUSCULAR | Status: DC | PRN
  Administered 2022-07-29: 2 mg via INTRAVENOUS

## 2022-07-29 MED ORDER — FENTANYL CITRATE (PF) 100 MCG/2ML IJ SOLN
INTRAMUSCULAR | Status: AC
  Filled 2022-07-29: qty 2

## 2022-07-29 MED ORDER — MIDAZOLAM HCL 2 MG/2ML IJ SOLN
INTRAMUSCULAR | Status: AC
  Filled 2022-07-29: qty 2

## 2022-07-29 MED ORDER — DEXAMETHASONE SODIUM PHOSPHATE 10 MG/ML IJ SOLN
INTRAMUSCULAR | Status: DC | PRN
  Administered 2022-07-29: 10 mg via INTRAVENOUS

## 2022-07-29 MED ORDER — OXYCODONE-ACETAMINOPHEN 5-325 MG PO TABS: 1.0000 | ORAL_TABLET | Freq: Three times a day (TID) | ORAL | 0 refills | Status: DC | PRN

## 2022-07-29 MED ORDER — FENTANYL CITRATE (PF) 100 MCG/2ML IJ SOLN
25.0000 ug | INTRAMUSCULAR | Status: DC | PRN
  Administered 2022-07-29 (×2): 25 ug via INTRAVENOUS

## 2022-07-29 MED ORDER — DOCUSATE SODIUM 100 MG PO CAPS: 100.0000 mg | ORAL_CAPSULE | Freq: Two times a day (BID) | ORAL | 0 refills | Status: AC | PRN

## 2022-07-29 MED ORDER — OXYCODONE HCL 5 MG PO TABS: 5.0000 mg | ORAL_TABLET | Freq: Once | ORAL | Status: AC

## 2022-07-29 MED ORDER — LACTATED RINGERS IV SOLN: INTRAVENOUS | Status: DC

## 2022-07-29 MED ORDER — PROPOFOL 10 MG/ML IV BOLUS
INTRAVENOUS | Status: DC | PRN
  Administered 2022-07-29: 200 mg via INTRAVENOUS

## 2022-07-29 MED ORDER — FENTANYL CITRATE (PF) 100 MCG/2ML IJ SOLN
INTRAMUSCULAR | Status: DC | PRN
  Administered 2022-07-29: 100 ug via INTRAVENOUS

## 2022-07-29 MED ORDER — ACETAMINOPHEN 500 MG PO TABS
ORAL_TABLET | ORAL | Status: AC
  Administered 2022-07-29: 1000 mg via ORAL
  Filled 2022-07-29: qty 2

## 2022-07-29 MED ORDER — ACETAMINOPHEN 325 MG PO TABS: 650.0000 mg | ORAL_TABLET | Freq: Three times a day (TID) | ORAL | 0 refills | Status: AC | PRN

## 2022-07-29 MED ORDER — INDOCYANINE GREEN 25 MG IV SOLR
1.2500 mg | Freq: Once | INTRAVENOUS | Status: DC
  Filled 2022-07-29: qty 10

## 2022-07-29 MED ORDER — SUGAMMADEX SODIUM 200 MG/2ML IV SOLN
INTRAVENOUS | Status: DC | PRN
  Administered 2022-07-29: 300 mg via INTRAVENOUS

## 2022-07-29 MED ORDER — CELECOXIB 200 MG PO CAPS
200.0000 mg | ORAL_CAPSULE | Freq: Once | ORAL | Status: AC
  Administered 2022-07-29: 200 mg via ORAL

## 2022-07-29 MED ORDER — LIDOCAINE-EPINEPHRINE 1 %-1:200000 IJ SOLN
INTRAMUSCULAR | Status: AC
Start: 2022-07-29 — End: ?
  Filled 2022-07-29: qty 30

## 2022-07-29 MED ORDER — OXYCODONE HCL 5 MG PO TABS
ORAL_TABLET | ORAL | Status: AC
  Administered 2022-07-29: 5 mg via ORAL
  Filled 2022-07-29: qty 1

## 2022-07-29 MED ORDER — FENTANYL CITRATE (PF) 100 MCG/2ML IJ SOLN
INTRAMUSCULAR | Status: AC
  Administered 2022-07-29: 25 ug via INTRAVENOUS
  Filled 2022-07-29: qty 2

## 2022-07-29 MED ORDER — CEFAZOLIN SODIUM-DEXTROSE 2-4 GM/100ML-% IV SOLN
2.0000 g | Freq: Once | INTRAVENOUS | Status: AC
  Administered 2022-07-29: 2 g via INTRAVENOUS

## 2022-07-29 MED ORDER — ORAL CARE MOUTH RINSE: 15.0000 mL | Freq: Once | OROMUCOSAL | Status: DC

## 2022-07-29 MED ORDER — LACTATED RINGERS IV SOLN: INTRAVENOUS | Status: DC | PRN

## 2022-07-29 MED ORDER — LIDOCAINE-EPINEPHRINE (PF) 1 %-1:200000 IJ SOLN
INTRAMUSCULAR | Status: DC | PRN
  Administered 2022-07-29: 20 mL via INTRAMUSCULAR

## 2022-07-29 MED ORDER — KETOROLAC TROMETHAMINE 30 MG/ML IJ SOLN
INTRAMUSCULAR | Status: DC | PRN
  Administered 2022-07-29: 30 mg via INTRAVENOUS

## 2022-07-29 MED ORDER — ROCURONIUM BROMIDE 100 MG/10ML IV SOLN
INTRAVENOUS | Status: DC | PRN
  Administered 2022-07-29: 20 mg via INTRAVENOUS
  Administered 2022-07-29: 50 mg via INTRAVENOUS

## 2022-07-29 MED ORDER — ONDANSETRON HCL 4 MG/2ML IJ SOLN
INTRAMUSCULAR | Status: DC | PRN
  Administered 2022-07-29: 4 mg via INTRAVENOUS

## 2022-07-29 MED ORDER — INDOCYANINE GREEN 25 MG IV SOLR
INTRAVENOUS | Status: DC | PRN
  Administered 2022-07-29: 2.5 mg via INTRAVENOUS

## 2022-07-29 MED ORDER — CHLORHEXIDINE GLUCONATE 0.12 % MT SOLN
OROMUCOSAL | Status: DC
Start: 2022-07-29 — End: 2022-07-29
  Filled 2022-07-29: qty 15

## 2022-07-29 MED ORDER — LIDOCAINE HCL (CARDIAC) PF 100 MG/5ML IV SOSY
PREFILLED_SYRINGE | INTRAVENOUS | Status: DC | PRN
  Administered 2022-07-29: 50 mg via INTRAVENOUS

## 2022-07-29 MED ORDER — ACETAMINOPHEN 500 MG PO TABS: 1000.0000 mg | ORAL_TABLET | Freq: Once | ORAL | Status: AC

## 2022-07-29 MED ORDER — CHLORHEXIDINE GLUCONATE 0.12 % MT SOLN: 15.0000 mL | Freq: Once | OROMUCOSAL | Status: DC

## 2022-07-29 MED ORDER — GABAPENTIN 300 MG PO CAPS: 300.0000 mg | ORAL_CAPSULE | Freq: Once | ORAL | Status: AC

## 2022-07-29 MED ORDER — DEXMEDETOMIDINE HCL IN NACL 200 MCG/50ML IV SOLN
INTRAVENOUS | Status: DC | PRN
  Administered 2022-07-29: 4 ug via INTRAVENOUS
  Administered 2022-07-29: 8 ug via INTRAVENOUS

## 2022-07-29 MED ORDER — FENTANYL CITRATE (PF) 100 MCG/2ML IJ SOLN: 25.0000 ug | INTRAMUSCULAR | Status: DC | PRN

## 2022-07-29 MED ORDER — CEFAZOLIN SODIUM-DEXTROSE 2-4 GM/100ML-% IV SOLN
INTRAVENOUS | Status: AC
  Filled 2022-07-29: qty 100

## 2022-07-29 MED ORDER — PROMETHAZINE HCL 25 MG/ML IJ SOLN
6.2500 mg | INTRAMUSCULAR | Status: DC | PRN
  Administered 2022-07-29: 6.25 mg via INTRAVENOUS

## 2022-07-29 MED ORDER — CELECOXIB 200 MG PO CAPS
ORAL_CAPSULE | ORAL | Status: AC
  Filled 2022-07-29: qty 1

## 2022-07-29 MED ORDER — GABAPENTIN 300 MG PO CAPS
ORAL_CAPSULE | ORAL | Status: AC
  Administered 2022-07-29: 300 mg via ORAL
  Filled 2022-07-29: qty 1

## 2022-07-29 MED ORDER — BUPIVACAINE HCL (PF) 0.5 % IJ SOLN
INTRAMUSCULAR | Status: AC
  Filled 2022-07-29: qty 30

## 2022-07-29 MED ORDER — IBUPROFEN 800 MG PO TABS: 800.0000 mg | ORAL_TABLET | Freq: Three times a day (TID) | ORAL | 0 refills | Status: DC | PRN

## 2022-07-29 SURGICAL SUPPLY — 56 items
ADH SKN CLS APL DERMABOND .7 (GAUZE/BANDAGES/DRESSINGS) ×2
ANCHOR TIS RET SYS 235ML (MISCELLANEOUS) ×2 IMPLANT
BAG PRESSURE INF REUSE 1000 (BAG) IMPLANT
BAG TISS RTRVL C235 10X14 (MISCELLANEOUS)
BLADE SURG SZ11 CARB STEEL (BLADE) ×2 IMPLANT
CANNULA REDUC XI 12-8 STAPL (CANNULA) ×2
CANNULA REDUCER 12-8 DVNC XI (CANNULA) ×2 IMPLANT
CATH REDDICK CHOLANGI 4FR 50CM (CATHETERS) IMPLANT
CLIP LIGATING HEMO O LOK GREEN (MISCELLANEOUS) ×2 IMPLANT
DERMABOND ADVANCED (GAUZE/BANDAGES/DRESSINGS) ×2
DERMABOND ADVANCED .7 DNX12 (GAUZE/BANDAGES/DRESSINGS) ×2 IMPLANT
DRAPE ARM DVNC X/XI (DISPOSABLE) ×8 IMPLANT
DRAPE C-ARM XRAY 36X54 (DRAPES) IMPLANT
DRAPE COLUMN DVNC XI (DISPOSABLE) ×2 IMPLANT
DRAPE DA VINCI XI ARM (DISPOSABLE) ×8
DRAPE DA VINCI XI COLUMN (DISPOSABLE) ×2
ELECT CAUTERY BLADE 6.4 (BLADE) ×2 IMPLANT
ELECT REM PT RETURN 9FT ADLT (ELECTROSURGICAL) ×2
ELECTRODE REM PT RTRN 9FT ADLT (ELECTROSURGICAL) ×2 IMPLANT
GLOVE BIOGEL PI IND STRL 7.0 (GLOVE) ×4 IMPLANT
GLOVE SURG SYN 6.5 ES PF (GLOVE) ×4 IMPLANT
GLOVE SURG SYN 6.5 PF PI (GLOVE) ×4 IMPLANT
GOWN STRL REUS W/ TWL LRG LVL3 (GOWN DISPOSABLE) ×6 IMPLANT
GOWN STRL REUS W/TWL LRG LVL3 (GOWN DISPOSABLE) ×6
GRASPER SUT TROCAR 14GX15 (MISCELLANEOUS) IMPLANT
IRRIGATOR SUCT 8 DISP DVNC XI (IRRIGATION / IRRIGATOR) IMPLANT
IRRIGATOR SUCTION 8MM XI DISP (IRRIGATION / IRRIGATOR)
IV NS 1000ML (IV SOLUTION)
IV NS 1000ML BAXH (IV SOLUTION) IMPLANT
LABEL OR SOLS (LABEL) ×2 IMPLANT
MANIFOLD NEPTUNE II (INSTRUMENTS) ×2 IMPLANT
NDL INSUFFLATION 14GA 120MM (NEEDLE) ×2 IMPLANT
NEEDLE HYPO 22GX1.5 SAFETY (NEEDLE) ×2 IMPLANT
NEEDLE INSUFFLATION 14GA 120MM (NEEDLE) ×2 IMPLANT
NS IRRIG 500ML POUR BTL (IV SOLUTION) ×2 IMPLANT
OBTURATOR OPTICAL STANDARD 8MM (TROCAR) ×2
OBTURATOR OPTICAL STND 8 DVNC (TROCAR) ×2
OBTURATOR OPTICALSTD 8 DVNC (TROCAR) ×2 IMPLANT
PACK LAP CHOLECYSTECTOMY (MISCELLANEOUS) ×2 IMPLANT
PENCIL SMOKE EVACUATOR (MISCELLANEOUS) ×2 IMPLANT
SEAL CANN UNIV 5-8 DVNC XI (MISCELLANEOUS) ×6 IMPLANT
SEAL XI 5MM-8MM UNIVERSAL (MISCELLANEOUS) ×6
SET TUBE SMOKE EVAC HIGH FLOW (TUBING) ×2 IMPLANT
SOLUTION ELECTROLUBE (MISCELLANEOUS) ×2 IMPLANT
SPIKE FLUID TRANSFER (MISCELLANEOUS) ×4 IMPLANT
STAPLER CANNULA SEAL DVNC XI (STAPLE) ×2 IMPLANT
STAPLER CANNULA SEAL XI (STAPLE) ×2
SUT MNCRL 4-0 (SUTURE) ×4
SUT MNCRL 4-0 27XMFL (SUTURE) ×4
SUT SILK 3 0 SH 30 (SUTURE) IMPLANT
SUT VICRYL 0 AB UR-6 (SUTURE) ×2 IMPLANT
SUTURE MNCRL 4-0 27XMF (SUTURE) ×4 IMPLANT
SYR 30ML LL (SYRINGE) IMPLANT
SYSTEM WECK SHIELD CLOSURE (TROCAR) IMPLANT
TRAP FLUID SMOKE EVACUATOR (MISCELLANEOUS) ×2 IMPLANT
WATER STERILE IRR 500ML POUR (IV SOLUTION) ×2 IMPLANT

## 2022-07-29 NOTE — Discharge Instructions (Addendum)
Laparoscopic Cholecystectomy, Care After This sheet gives you information about how to care for yourself after your procedure. Your doctor may also give you more specific instructions. If you have problems or questions, contact your doctor. Follow these instructions at home: Care for cuts from surgery (incisions)  Follow instructions from your doctor about how to take care of your cuts from surgery. Make sure you: Wash your hands with soap and water before you change your bandage (dressing). If you cannot use soap and water, use hand sanitizer. Change your bandage as told by your doctor. Leave stitches (sutures), skin glue, or skin tape (adhesive) strips in place. They may need to stay in place for 2 weeks or longer. If tape strips get loose and curl up, you may trim the loose edges. Do not remove tape strips completely unless your doctor says it is okay. Do not take baths, swim, or use a hot tub until your doctor says it is okay. OK TO SHOWER 24HRS AFTER YOUR SURGERY.  Check your surgical cut area every day for signs of infection. Check for: More redness, swelling, or pain. More fluid or blood. Warmth. Pus or a bad smell. Activity Do not drive or use heavy machinery while taking prescription pain medicine. Do not play contact sports until your doctor says it is okay. Do not drive for 24 hours if you were given a medicine to help you relax (sedative). Rest as needed. Do not return to work or school until your doctor says it is okay. General instructions  tylenol and advil as needed for discomfort.  Please alternate between the two every four hours as needed for pain.    Use narcotics, if prescribed, only when tylenol and motrin is not enough to control pain.  325-650mg every 8hrs to max of 3000mg/24hrs (including the 325mg in every norco dose) for the tylenol.    Advil up to 800mg per dose every 8hrs as needed for pain.   To prevent or treat constipation while you are taking prescription  pain medicine, your doctor may recommend that you: Drink enough fluid to keep your pee (urine) clear or pale yellow. Take over-the-counter or prescription medicines. Eat foods that are high in fiber, such as fresh fruits and vegetables, whole grains, and beans. Limit foods that are high in fat and processed sugars, such as fried and sweet foods. Contact a doctor if: You develop a rash. You have more redness, swelling, or pain around your surgical cuts. You have more fluid or blood coming from your surgical cuts. Your surgical cuts feel warm to the touch. You have pus or a bad smell coming from your surgical cuts. You have a fever. One or more of your surgical cuts breaks open. You have trouble breathing. You have chest pain. You have pain that is getting worse in your shoulders. You faint or feel dizzy when you stand. You have very bad pain in your belly (abdomen). You are sick to your stomach (nauseous) for more than one day. You have throwing up (vomiting) that lasts for more than one day. You have leg pain. This information is not intended to replace advice given to you by your health care provider. Make sure you discuss any questions you have with your health care provider. Document Released: 08/17/2008 Document Revised: 05/29/2016 Document Reviewed: 04/26/2016 Elsevier Interactive Patient Education  2019 Elsevier Inc[employer]     AMBULATORY SURGERY  DISCHARGE INSTRUCTIONS  The drugs that you were given will stay in your system until tomorrow   so for the next 24 hours you should not:  Drive an automobile Make any legal decisions Drink any alcoholic beverage  You may resume regular meals tomorrow.  Today it is better to start with liquids and gradually work up to solid foods.  You may eat anything you prefer, but it is better to start with liquids, then soup and crackers, and gradually work up to solid foods.  Please notify your doctor immediately if you have any unusual bleeding,  trouble breathing, redness and pain at the surgery site, drainage, fever, or pain not relieved by medication.  Additional Instructions:  Please contact your physician with any problems or Same Day Surgery at [phone], Monday through Friday 6 am to 4 pm, or [place] [facility] number at [phone]. 

## 2022-07-29 NOTE — Interval H&P Note (Signed)
History and Physical Interval Note:  07/29/2022 2:15 PM  Angela Nunez  has presented today for surgery, with the diagnosis of Choleycysitis.  The various methods of treatment have been discussed with the patient and family. After consideration of risks, benefits and other options for treatment, the patient has consented to  Procedure(s): XI ROBOTIC ASSISTED LAPAROSCOPIC CHOLECYSTECTOMY (N/A) INDOCYANINE GREEN FLUORESCENCE IMAGING (ICG) (N/A) as a surgical intervention.  The patient's history has been reviewed, patient examined, no change in status, stable for surgery.  I have reviewed the patient's chart and labs.  Questions were answered to the patient's satisfaction.     Merrick Maggio Tonna Boehringer

## 2022-07-29 NOTE — H&P (Signed)
Subjective:    CC: Biliary colic [K80.50]   HPI:  Angela Nunez is a 31 y.o. female who was referred by Alfredia Ferguson for evaluation of above CC. Symptoms were first noted a few weeks ago. Pain is sharp, radiating from the right upper quadrant to back.  Associated with nothing specific, exacerbated by nothing specific.     Past Medical History:  has a past medical history of Hypertension and Migraine headache.   Past Surgical History:  has a past surgical history that includes wisdom teeth.   Family History: family history includes Cancer in her paternal grandmother; Diabetes in her paternal grandfather.   Social History:  reports that she has never smoked. She has never used smokeless tobacco. She reports current alcohol use. Drug use questions deferred to the physician.   Current Medications: has a current medication list which includes the following prescription(s): betamethasone dipropionate (augmented), hydroxyzine, levonorgestrel, levothyroxine, and norgestimate-ethinyl estradiol.   Allergies:     Allergies as of 07/28/2022   (No Known Allergies)      ROS:  A 15 point review of systems was performed and pertinent positives and negatives noted in HPI    Objective:    BP (!) 144/100   Pulse 110   Ht 154.9 cm (5\' 1" )   Wt 80.3 kg (177 lb)   BMI 33.44 kg/m      Constitutional :  No distress, cooperative, alert  Lymphatics/Throat:  Supple with no lymphadenopathy  Respiratory:  Clear to auscultation bilaterally  Cardiovascular:  Regular rate and rhythm  Gastrointestinal: Soft, non-tender, non-distended, no organomegaly.  Musculoskeletal: Steady gait and movement  Skin: Cool and moist  Psychiatric: Normal affect, non-agitated, not confused         LABS:  AP- 169    RADS: CLINICAL DATA:  Right upper quadrant pain radiating into the back for 2 days   EXAM: ULTRASOUND ABDOMEN LIMITED RIGHT UPPER QUADRANT   COMPARISON:  None Available.    FINDINGS: Gallbladder:   No wall thickening visualized. Multiple small stones and sludge. Stones measure up to 1.5 cm. No sonographic Murphy sign noted by sonographer.   Common bile duct:   Diameter: 0.4 cm, within normal limits   Liver:   No focal lesion identified. Within normal limits in parenchymal echogenicity. Portal vein is patent on color Doppler imaging with normal direction of blood flow towards the liver.   Other: None.   IMPRESSION: Cholelithiasis without evidence of acute cholecystitis.     Electronically Signed   By: M.D.   On: 07/19/2022 10:49 Assessment:       Biliary colic [K80.50] H&P, imaging above consistent   Plan:    1. Biliary colic [K80.50] Discussed the risk of surgery including post-op infxn, seroma, biloma, chronic pain, poor-delayed wound healing, retained gallstone, conversion to open procedure, post-op SBO or ileus, and need for additional procedures to address said risks.  The risks of general anesthetic including MI, CVA, sudden death or even reaction to anesthetic medications also discussed. Alternatives include continued observation.  Benefits include possible symptom relief, prevention of complications including acute cholecystitis, pancreatitis.   Typical post operative recovery of 3-5 days rest, continued pain in area and incision sites, possible loose stools up to 4-6 weeks, also discussed.   ED return precautions given for sudden increase in RUQ pain, with possible accompanying fever, nausea, and/or vomiting.   The patient understands the risks, any and all questions were answered to the patient's satisfaction.   2. Patient  has elected to proceed with surgical treatment. Procedure will be scheduled.  Written consent was obtained.robotic assisted laparoscopic   labs/images/medications/previous chart entries reviewed personally and relevant changes/updates noted above.

## 2022-07-29 NOTE — Op Note (Signed)
Preoperative diagnosis:  biliary colic  Postoperative diagnosis: same as above plus small bowel serosal injury  Procedure: Robotic assisted Laparoscopic Cholecystectomy.   Anesthesia: GETA   Surgeon: Sung Amabile  Specimen: Gallbladder  Complications: None  EBL: 68mL  Wound Classification: Clean Contaminated  Indications: see HPI  Findings: Serosal tear of small bowel during port insertion. Critical view of safety noted Cystic duct and artery identified, ligated and divided, clips remained intact at end of procedure Adequate hemostasis  Description of procedure:  The patient was placed on the operating table in the supine position. SCDs placed, pre-op abx administered.  General anesthesia was induced and OG tube placed by anesthesia. A time-out was completed verifying correct patient, procedure, site, positioning, and implant(s) and/or special equipment prior to beginning this procedure. The abdomen was prepped and draped in the usual sterile fashion.    Veress needle was placed at the Palmer's point and insufflation was started after confirming a positive saline drop test and no immediate increase in abdominal pressure.  After reaching 15 mm, the Veress needle was removed and a 8 mm port was placed via optiview technique under umbilicus measured 38mm from gallbladder.  The abdomen was inspected and no abnormalities or injuries were found.  Under direct vision, ports were placed in the following locations: One 12 mm patient left of the umbilicus, 8cm from the optiviewed port, one 8 mm port placed to the patient right of the umbilical port 8 cm apart.  1 additional 8 mm port placed lateral to the 42mm port.  Once ports were placed, The table was placed in the reverse Trendelenburg position with the right side up. The Xi platform was brought into the operative field and docked to the ports successfully.  An endoscope was placed through the umbilical port, fenestrated grasper through the  adjacent patient right port, prograsp to the far patient left port, and then a hook cautery in the left port.  Inspected of the area of small bleeding below RLQ port noted pinhole serosal tear.  3-0 silk x1 used to repair.   The dome of the gallbladder was grasped with prograsp, passed and retracted over the dome of the liver. Adhesions between the gallbladder and omentum, duodenum and transverse colon were lysed via hook cautery. The infundibulum was grasped with the fenestrated grasper and retracted toward the right lower quadrant. This maneuver exposed Calot's triangle. The peritoneum overlying the gallbladder infundibulum was then dissected  and the cystic duct and cystic artery identified.  Critical view of safety with the liver bed clearly visible behind the duct and artery with no additional structures noted.  The cystic duct and cystic artery clipped and divided close to the gallbladder.     The gallbladder was then dissected from its peritoneal and liver bed attachments by electrocautery. Hemostasis was checked prior to removing the hook cautery and the Endo Catch bag was then placed through the 12 mm port and the gallbladder was removed.  The gallbladder was passed off the table as a specimen. There was no evidence of bleeding from the gallbladder fossa or cystic artery or leakage of the bile from the cystic duct stump. The 12 mm port site closed with PMI using 0 vicryl under direct vision.  Abdomen desufflated and secondary trocars were removed under direct vision. No bleeding was noted. All skin incisions then closed with subcuticular sutures of 4-0 monocryl and dressed with topical skin adhesive. The orogastric tube was removed and patient extubated.  The patient tolerated the  procedure well and was taken to the postanesthesia care unit in stable condition.  All sponge and instrument count correct at end of procedure.

## 2022-07-29 NOTE — Anesthesia Preprocedure Evaluation (Addendum)
Anesthesia Evaluation  Patient identified by MRN, date of birth, ID band Patient awake    Reviewed: Allergy & Precautions, NPO status , Patient's Chart, lab work & pertinent test results  History of Anesthesia Complications Negative for: history of anesthetic complications  Airway Mallampati: II  TM Distance: >3 FB Neck ROM: full    Dental  (+) Chipped, Dental Advidsory Given, Teeth Intact   Pulmonary neg pulmonary ROS,    Pulmonary exam normal        Cardiovascular negative cardio ROS Normal cardiovascular exam     Neuro/Psych PSYCHIATRIC DISORDERS Anxiety negative neurological ROS     GI/Hepatic negative GI ROS, Neg liver ROS, Choleycysitis   Endo/Other  neg diabetesHypothyroidism   Renal/GU      Musculoskeletal   Abdominal   Peds  Hematology negative hematology ROS (+)   Anesthesia Other Findings Past Medical History: No date: Hypothyroidism No date: Vaccine for human papilloma virus (HPV) types 6, 11, 16, and  18 administered  Past Surgical History: No date: WISDOM TOOTH EXTRACTION     Comment:  x4 extracted      Reproductive/Obstetrics negative OB ROS                            Anesthesia Physical Anesthesia Plan  ASA: 2  Anesthesia Plan: General   Post-op Pain Management: Gabapentin PO (pre-op)*, Celebrex PO (pre-op)* and Tylenol PO (pre-op)*   Induction: Intravenous  PONV Risk Score and Plan: 3 and Ondansetron, Dexamethasone, Midazolam, Treatment may vary due to age or medical condition and Promethazine  Airway Management Planned: Oral ETT  Additional Equipment:   Intra-op Plan:   Post-operative Plan: Extubation in OR  Informed Consent:   Plan Discussed with: Anesthesiologist, CRNA and Surgeon  Anesthesia Plan Comments:        Anesthesia Quick Evaluation

## 2022-07-29 NOTE — Transfer of Care (Addendum)
Immediate Anesthesia Transfer of Care Note  Patient: Angela Nunez  Procedure(s) Performed: XI ROBOTIC ASSISTED LAPAROSCOPIC CHOLECYSTECTOMY (Abdomen) INDOCYANINE GREEN FLUORESCENCE IMAGING (ICG)  Patient Location: PACU  Anesthesia Type:General  Level of Consciousness: drowsy  Airway & Oxygen Therapy: Patient Spontanous Breathing and Patient connected to face mask oxygen  Post-op Assessment: Report given to RN and Post -op Vital signs reviewed and stable  Post vital signs: Reviewed and stable  Last Vitals:  Vitals Value Taken Time  BP    Temp    Pulse    Resp    SpO2      Last Pain:  Vitals:   07/29/22 1349  TempSrc: Oral  PainSc: 0-No pain         Complications: No notable events documented.

## 2022-07-29 NOTE — Anesthesia Procedure Notes (Signed)
Procedure Name: Intubation Date/Time: 07/29/2022 2:43 PM  Performed by: Philbert Riser, CRNAPre-anesthesia Checklist: Patient identified, Patient being monitored, Timeout performed, Emergency Drugs available and Suction available Patient Re-evaluated:Patient Re-evaluated prior to induction Oxygen Delivery Method: Circle system utilized Preoxygenation: Pre-oxygenation with 100% oxygen Induction Type: IV induction Ventilation: Mask ventilation without difficulty Laryngoscope Size: 3 and McGraph Grade View: Grade I Tube type: Oral Tube size: 7.0 mm Number of attempts: 1 Airway Equipment and Method: Stylet Placement Confirmation: ETT inserted through vocal cords under direct vision, positive ETCO2 and breath sounds checked- equal and bilateral Secured at: 21 cm Tube secured with: Tape Dental Injury: Teeth and Oropharynx as per pre-operative assessment

## 2022-08-02 ENCOUNTER — Ambulatory Visit: Payer: BC Managed Care – PPO | Admitting: Surgery

## 2022-08-02 LAB — SURGICAL PATHOLOGY

## 2022-08-03 NOTE — Anesthesia Postprocedure Evaluation (Signed)
Anesthesia Post Note  Patient: Angela Nunez  Procedure(s) Performed: XI ROBOTIC ASSISTED LAPAROSCOPIC CHOLECYSTECTOMY (Abdomen) INDOCYANINE GREEN FLUORESCENCE IMAGING (ICG)  Patient location during evaluation: PACU Anesthesia Type: General Level of consciousness: awake and alert Pain management: pain level controlled Vital Signs Assessment: post-procedure vital signs reviewed and stable Respiratory status: spontaneous breathing, nonlabored ventilation, respiratory function stable and patient connected to nasal cannula oxygen Cardiovascular status: blood pressure returned to baseline and stable Postop Assessment: no apparent nausea or vomiting Anesthetic complications: no   No notable events documented.   Last Vitals:  Vitals:   07/29/22 1722 07/29/22 1749  BP: 132/84 130/82  Pulse:  92  Resp: 16 16  Temp: 36.8 C 36.8 C  SpO2: 96% 96%    Last Pain:  Vitals:   07/30/22 0944  TempSrc:   PainSc: 4                  Lenard Simmer

## 2022-12-29 NOTE — Progress Notes (Deleted)
I,Sha'taria Aariz Maish,acting as a Education administrator for [employer], PA-C.,have documented all relevant documentation on the behalf of Mikey Kirschner, PA-C,as directed by  Mikey Kirschner, PA-C while in the presence of Mikey Kirschner, PA-C.   Complete physical exam   Patient: Angela Nunez   DOB: 1991-07-18   32 y.o. Female  MRN: [id number] Visit Date: 12/30/2022  Today's healthcare provider: Mikey Kirschner, PA-C   No chief complaint on file.  Subjective    Angela Nunez is a 32 y.o. female who presents today for a complete physical exam.  She reports consuming a {diet types:17450} diet. {Exercise:19826} She generally feels {well/fairly well/poorly:18703}. She reports sleeping {well/fairly well/poorly:18703}. She {does/does not:200015} have additional problems to discuss today.  HPI  -Influenza Vaccine:  Past Medical History:  Diagnosis Date   Hypothyroidism    Vaccine for human papilloma virus (HPV) types 6, 11, 16, and 18 administered    Past Surgical History:  Procedure Laterality Date   WISDOM TOOTH EXTRACTION     x4 extracted    Social History   Socioeconomic History   Marital status: Married    Spouse name: Not on file   Number of children: Not on file   Years of education: Not on file   Highest education level: Not on file  Occupational History   Occupation: Accountant  Tobacco Use   Smoking status: Never   Smokeless tobacco: Never  Vaping Use   Vaping Use: Never used  Substance and Sexual Activity   Alcohol use: Not Currently    Comment: Once or twice a month   Drug use: No   Sexual activity: Yes    Birth control/protection: I.U.D.    Comment: [name]  Other Topics Concern   Not on file  Social History Narrative   Not on file   Social Determinants of Health   Financial Resource Strain: Not on file  Food Insecurity: Not on file  Transportation Needs: Not on file  Physical Activity: Not on file  Stress: Not on file  Social Connections: Not on file   Intimate Partner Violence: Not on file   Family Status  Relation Name Status   Mother  Alive   Father  Alive   Daughter  Alive   PGM  Alive   Other [facility] Alive   Family History  Problem Relation Age of Onset   Breast cancer Paternal Grandmother    Breast cancer Other    No Known Allergies  Patient Care Team: Mikey Kirschner, PA-C as PCP - General (Physician Assistant)   Medications: Outpatient Medications Prior to Visit  Medication Sig   hydrOXYzine (ATARAX/VISTARIL) 25 MG tablet Take 1 tablet (25 mg total) by mouth 3 (three) times daily as needed for anxiety.   ibuprofen (ADVIL) 800 MG tablet Take 1 tablet (800 mg total) by mouth every 8 (eight) hours as needed for mild pain or moderate pain.   levonorgestrel ([name]) 20 MCG/24HR IUD 1 Intra Uterine Device (1 each total) by Intrauterine route once for 1 dose.   oxyCODONE-acetaminophen (PERCOCET) 5-325 MG tablet Take 1 tablet by mouth every 8 (eight) hours as needed for severe pain.   No facility-administered medications prior to visit.    Review of Systems  {Labs  Heme  Chem  Endocrine  Serology  Results Review (optional):23779}  Objective    There were no vitals taken for this visit. {Show previous vital signs (optional):23777}   Physical Exam  ***  Last depression screening scores    07/12/2022  10:35 AM 12/29/2021    3:02 PM 07/31/2021   10:25 AM  PHQ 2/9 Scores  PHQ - 2 Score 0 0 2  PHQ- 9 Score 2 0 6   Last fall risk screening    07/12/2022   10:35 AM  [place] in the past year? 0  Number falls in past yr: 0  Injury with Fall? 0  Risk for fall due to : No Fall Risks   Last Audit-C alcohol use screening    07/12/2022   10:35 AM  Alcohol Use Disorder Test (AUDIT)  1. How often do you have a drink containing alcohol? 2  2. How many drinks containing alcohol do you have on a typical day when you are drinking? 0  3. How often do you have six or more drinks on one occasion? 0  AUDIT-C  Score 2   A score of 3 or more in women, and 4 or more in men indicates increased risk for alcohol abuse, EXCEPT if all of the points are from question 1   No results found for any visits on 12/30/22.  Assessment & Plan    Routine Health Maintenance and Physical Exam  Exercise Activities and Dietary recommendations  Goals   None     Immunization History  Administered Date(s) Administered   DTaP 07/06/1993, 09/17/1993, 03/31/1994, 05/09/1995, 07/17/1997   HIB (PRP-OMP) 07/06/1993, 09/17/1993, 03/31/1994   HPV 9-valent 07/08/2006, 05/03/2007, 10/13/2007   Hepatitis A 07/08/2006, 05/03/2007   Hepatitis B 07-08-93, 07/06/1993, 04/02/1994   IPV 07/06/1993, 09/17/1993, 03/31/1994, 05/09/1995, 07/17/1997   Influenza,inj,Quad PF,6+ Mos 08/29/2019   Influenza-Unspecified 09/03/2015, 11/03/2016, 08/24/2021   MMR 05/07/1995, 07/17/1997   Meningococcal Conjugate 06/27/2008   Meningococcal Mcv4o 08/30/2011   PFIZER(Purple Top)SARS-COV-2 Vaccination 08/03/2020, 08/24/2020   Td 07/08/2006, 07/01/2017   Tdap 07/08/2006, 02/26/2020   Varicella 08/30/2011    Health Maintenance  Topic Date Due   INFLUENZA VACCINE  06/22/2022   COVID-19 Vaccine (3 - 2023-24 season) 07/23/2022   PAP-Cervical Cytology Screening  12/29/2024   PAP SMEAR-Modifier  12/29/2024   DTaP/Tdap/Td (10 - Td or Tdap) 02/25/2030   HPV VACCINES  Completed   Hepatitis C Screening  Completed   HIV Screening  Completed    Discussed health benefits of physical activity, and encouraged her to engage in regular exercise appropriate for her age and condition.  ***  No follow-ups on file.     {provider attestation***:1}   Mikey Kirschner, PA-C  [place] [phone] (phone) [phone] (fax)  [facility]

## 2022-12-30 ENCOUNTER — Encounter: Payer: BC Managed Care – PPO | Admitting: Physician Assistant

## 2022-12-30 ENCOUNTER — Encounter: Payer: Self-pay | Admitting: Physician Assistant

## 2022-12-30 ENCOUNTER — Ambulatory Visit (INDEPENDENT_AMBULATORY_CARE_PROVIDER_SITE_OTHER): Payer: No Typology Code available for payment source | Admitting: Physician Assistant

## 2022-12-30 VITALS — BP 137/86 | HR 94 | Ht 61.0 in | Wt 184.8 lb

## 2022-12-30 DIAGNOSIS — E559 Vitamin D deficiency, unspecified: Secondary | ICD-10-CM | POA: Diagnosis not present

## 2022-12-30 DIAGNOSIS — F419 Anxiety disorder, unspecified: Secondary | ICD-10-CM

## 2022-12-30 DIAGNOSIS — E782 Mixed hyperlipidemia: Secondary | ICD-10-CM

## 2022-12-30 DIAGNOSIS — Z0000 Encounter for general adult medical examination without abnormal findings: Secondary | ICD-10-CM

## 2022-12-30 MED ORDER — HYDROXYZINE HCL 25 MG PO TABS: 25.0000 mg | ORAL_TABLET | Freq: Three times a day (TID) | ORAL | 3 refills | Status: DC | PRN

## 2022-12-30 NOTE — Progress Notes (Signed)
I,[name],acting as a Education administrator for [employer], PA-C.,have documented all relevant documentation on the behalf of Mikey Kirschner, PA-C,as directed by  Mikey Kirschner, PA-C while in the presence of Mikey Kirschner, PA-C.   Complete physical exam   Patient: Angela Nunez   DOB: [birth year]   32 y.o. Female  MRN: [id number] Visit Date: 12/30/2022  Today's healthcare provider: Mikey Kirschner, PA-C   Cc. Cpe   Subjective    Angela Nunez is a 32 y.o. female who presents today for a complete physical exam.  She reports consuming a general diet. The patient does not participate in regular exercise at present. She generally feels well. She reports sleeping well. She does not have additional problems to discuss today.  HPI   Past Medical History:  Diagnosis Date   Hypothyroidism    Vaccine for human papilloma virus (HPV) types 6, 11, 71, and 18 administered    Past Surgical History:  Procedure Laterality Date   WISDOM TOOTH EXTRACTION     x4 extracted    Social History   Socioeconomic History   Marital status: Married    Spouse name: Not on file   Number of children: Not on file   Years of education: Not on file   Highest education level: Not on file  Occupational History   Occupation: Accountant  Tobacco Use   Smoking status: Never   Smokeless tobacco: Never  Vaping Use   Vaping Use: Never used  Substance and Sexual Activity   Alcohol use: Not Currently    Comment: Once or twice a month   Drug use: No   Sexual activity: Yes    Birth control/protection: I.U.D.    Comment: [name]  Other Topics Concern   Not on file  Social History Narrative   Not on file   Social Determinants of Health   Financial Resource Strain: Not on file  Food Insecurity: Not on file  Transportation Needs: Not on file  Physical Activity: Not on file  Stress: Not on file  Social Connections: Not on file  Intimate Partner Violence: Not on file   Family Status  Relation Name  Status   Mother  Alive   Father  Alive   Daughter  Alive   PGM  Alive   Other [facility] Alive   Family History  Problem Relation Age of Onset   Breast cancer Paternal Grandmother 79 - 69   Breast cancer Other    No Known Allergies  Patient Care Team: Mikey Kirschner, PA-C as PCP - General (Physician Assistant)   Medications: Outpatient Medications Prior to Visit  Medication Sig   ibuprofen (ADVIL) 800 MG tablet Take 1 tablet (800 mg total) by mouth every 8 (eight) hours as needed for mild pain or moderate pain.   [DISCONTINUED] hydrOXYzine (ATARAX/VISTARIL) 25 MG tablet Take 1 tablet (25 mg total) by mouth 3 (three) times daily as needed for anxiety.   levonorgestrel ([name]) 20 MCG/24HR IUD 1 Intra Uterine Device (1 each total) by Intrauterine route once for 1 dose.   [DISCONTINUED] oxyCODONE-acetaminophen (PERCOCET) 5-325 MG tablet Take 1 tablet by mouth every 8 (eight) hours as needed for severe pain. (Patient not taking: Reported on 12/30/2022)   No facility-administered medications prior to visit.    Review of Systems  Constitutional:  Negative for fatigue and fever.  Respiratory:  Negative for cough and shortness of breath.   Cardiovascular:  Negative for chest pain and leg swelling.  Gastrointestinal:  Negative for abdominal  pain.  Neurological:  Negative for dizziness and headaches.     Objective    Blood pressure 137/86, pulse 94, height 5\' 1"  (1.549 m), weight 184 lb 12.8 oz (83.8 kg), last menstrual period 12/10/2022, SpO2 100 %.    Physical Exam Constitutional:      General: She is awake.     Appearance: She is well-developed. She is not ill-appearing.  HENT:     Head: Normocephalic.     Right Ear: Tympanic membrane normal.     Left Ear: Tympanic membrane normal.     Nose: Nose normal. No congestion or rhinorrhea.     Mouth/Throat:     Pharynx: No oropharyngeal exudate or posterior oropharyngeal erythema.  Eyes:     Conjunctiva/sclera: Conjunctivae normal.      Pupils: Pupils are equal, round, and reactive to light.  Neck:     Thyroid: No thyroid mass or thyromegaly.  Cardiovascular:     Rate and Rhythm: Normal rate and regular rhythm.     Heart sounds: Normal heart sounds.  Pulmonary:     Effort: Pulmonary effort is normal.     Breath sounds: Normal breath sounds.  Abdominal:     Palpations: Abdomen is soft.     Tenderness: There is no abdominal tenderness.  Musculoskeletal:     Right lower leg: No swelling. No edema.     Left lower leg: No swelling. No edema.  Lymphadenopathy:     Cervical: No cervical adenopathy.  Skin:    General: Skin is warm.  Neurological:     Mental Status: She is alert and oriented to person, place, and time.  Psychiatric:        Attention and Perception: Attention normal.        Mood and Affect: Mood normal.        Speech: Speech normal.        Behavior: Behavior normal. Behavior is cooperative.     Last depression screening scores    12/30/2022    9:01 AM 07/12/2022   10:35 AM 12/29/2021    3:02 PM  PHQ 2/9 Scores  PHQ - 2 Score 0 0 0  PHQ- 9 Score 0 2 0   Last fall risk screening    12/30/2022    9:01 AM  [place] in the past year? 0  Number falls in past yr: 0  Injury with Fall? 0  Risk for fall due to : No Fall Risks  Follow up Falls evaluation completed   Last Audit-C alcohol use screening    12/30/2022    9:00 AM  Alcohol Use Disorder Test (AUDIT)  1. How often do you have a drink containing alcohol? 1  2. How many drinks containing alcohol do you have on a typical day when you are drinking? 0  3. How often do you have six or more drinks on one occasion? 0  AUDIT-C Score 1   A score of 3 or more in women, and 4 or more in men indicates increased risk for alcohol abuse, EXCEPT if all of the points are from question 1   No results found for any visits on 12/30/22.  Assessment & Plan    Routine Health Maintenance and Physical Exam  Exercise Activities and Dietary  recommendations --balanced diet high in fiber and protein, low in sugars, carbs, fats. --physical activity/exercise 30 minutes 3-5 times a week     Immunization History  Administered Date(s) Administered   DTaP 07/06/1993, 09/17/1993,  03/31/1994, 05/09/1995, 07/17/1997   HIB (PRP-OMP) 07/06/1993, 09/17/1993, 03/31/1994   HPV 9-valent 07/08/2006, 05/03/2007, 10/13/2007   Hepatitis A 07/08/2006, 05/03/2007   Hepatitis B Dec 24, 1992, 07/06/1993, 04/02/1994   IPV 07/06/1993, 09/17/1993, 03/31/1994, 05/09/1995, 07/17/1997   Influenza,inj,Quad PF,6+ Mos 08/29/2019, 08/23/2022   Influenza-Unspecified 09/03/2015, 11/03/2016, 08/24/2021   MMR 05/07/1995, 07/17/1997   Meningococcal Conjugate 06/27/2008   Meningococcal Mcv4o 08/30/2011   PFIZER(Purple Top)SARS-COV-2 Vaccination 08/03/2020, 08/24/2020   Td 07/08/2006, 07/01/2017   Tdap 07/08/2006, 02/26/2020   Varicella 08/30/2011    Health Maintenance  Topic Date Due   COVID-19 Vaccine (3 - 2023-24 season) 07/23/2022   PAP-Cervical Cytology Screening  12/29/2024   PAP SMEAR-Modifier  12/29/2024   DTaP/Tdap/Td (10 - Td or Tdap) 02/25/2030   INFLUENZA VACCINE  Completed   HPV VACCINES  Completed   Hepatitis C Screening  Completed   HIV Screening  Completed    Discussed health benefits of physical activity, and encouraged her to engage in regular exercise appropriate for her age and condition.  Problem List Items Addressed This Visit       Other   Anxiety    Situational, manages with hydroxyzine  PRN, okay to continue      Relevant Medications   hydrOXYzine (ATARAX) 25 MG tablet   Avitaminosis D    Will repeat level, pt taking supplement at home      Relevant Orders   Vitamin D (25 hydroxy)   Moderate mixed hyperlipidemia not requiring statin therapy    Will repeat fasting lipids/cmp today      Relevant Orders   Lipid Profile   Other Visit Diagnoses     Annual physical exam    -  Primary   Relevant Orders   Lipid  Profile   Comprehensive Metabolic Panel (CMET)   TSH        Return in about 1 year (around 12/31/2023) for CPE.     I, Mikey Kirschner, PA-C have reviewed all documentation for this visit. The documentation on  12/30/22 for the exam, diagnosis, procedures, and orders are all accurate and complete.  Mikey Kirschner, PA-C [facility] [address] [place], [place], [address] Office: [phone] Fax: [place]

## 2022-12-30 NOTE — Assessment & Plan Note (Addendum)
Situational, manages with hydroxyzine  PRN, okay to continue

## 2022-12-30 NOTE — Assessment & Plan Note (Signed)
Will repeat level, pt taking supplement at home

## 2022-12-30 NOTE — Assessment & Plan Note (Signed)
Will repeat fasting lipids/cmp today

## 2022-12-31 ENCOUNTER — Encounter: Payer: Self-pay | Admitting: Physician Assistant

## 2022-12-31 ENCOUNTER — Other Ambulatory Visit: Payer: Self-pay | Admitting: Physician Assistant

## 2022-12-31 DIAGNOSIS — E559 Vitamin D deficiency, unspecified: Secondary | ICD-10-CM

## 2022-12-31 LAB — COMPREHENSIVE METABOLIC PANEL
ALT: 19 IU/L (ref 0–32)
AST: 18 IU/L (ref 0–40)
Albumin/Globulin Ratio: 1.8 (ref 1.2–2.2)
Albumin: 4.6 g/dL (ref 4.0–5.0)
Alkaline Phosphatase: 74 IU/L (ref 44–121)
BUN/Creatinine Ratio: 14 (ref 9–23)
BUN: 10 mg/dL (ref 6–20)
Bilirubin Total: 0.8 mg/dL (ref 0.0–1.2)
CO2: 21 mmol/L (ref 20–29)
Calcium: 9.8 mg/dL (ref 8.7–10.2)
Chloride: 102 mmol/L (ref 96–106)
Creatinine, Ser: 0.74 mg/dL (ref 0.57–1.00)
Globulin, Total: 2.5 g/dL (ref 1.5–4.5)
Glucose: 84 mg/dL (ref 70–99)
Potassium: 4.9 mmol/L (ref 3.5–5.2)
Sodium: 137 mmol/L (ref 134–144)
Total Protein: 7.1 g/dL (ref 6.0–8.5)
eGFR: 112 mL/min/{1.73_m2} (ref >59)

## 2022-12-31 LAB — VITAMIN D 25 HYDROXY (VIT D DEFICIENCY, FRACTURES): Vit D, 25-Hydroxy: 19.5 ng/mL — ABNORMAL LOW (ref 30.0–100.0)

## 2022-12-31 LAB — LIPID PANEL
Chol/HDL Ratio: 4.5 ratio — ABNORMAL HIGH (ref 0.0–4.4)
Cholesterol, Total: 199 mg/dL (ref 100–199)
HDL: 44 mg/dL (ref >39)
LDL Chol Calc (NIH): 137 mg/dL — ABNORMAL HIGH (ref 0–99)
Triglycerides: 100 mg/dL (ref 0–149)
VLDL Cholesterol Cal: 18 mg/dL (ref 5–40)

## 2022-12-31 LAB — TSH: TSH: 2.21 u[IU]/mL (ref 0.450–4.500)

## 2022-12-31 MED ORDER — VITAMIN D3 250 MCG (10000 UT) PO CAPS: 20000.0000 [IU] | ORAL_CAPSULE | Freq: Every day | ORAL | 0 refills | Status: AC

## 2023-07-11 ENCOUNTER — Ambulatory Visit (INDEPENDENT_AMBULATORY_CARE_PROVIDER_SITE_OTHER): Payer: No Typology Code available for payment source | Admitting: Family Medicine

## 2023-07-11 ENCOUNTER — Encounter: Payer: Self-pay | Admitting: Family Medicine

## 2023-07-11 VITALS — BP 139/99 | HR 107 | Ht 61.0 in | Wt 180.0 lb

## 2023-07-11 DIAGNOSIS — J039 Acute tonsillitis, unspecified: Secondary | ICD-10-CM | POA: Diagnosis not present

## 2023-07-11 DIAGNOSIS — R030 Elevated blood-pressure reading, without diagnosis of hypertension: Secondary | ICD-10-CM | POA: Diagnosis not present

## 2023-07-11 DIAGNOSIS — H66002 Acute suppurative otitis media without spontaneous rupture of ear drum, left ear: Secondary | ICD-10-CM | POA: Insufficient documentation

## 2023-07-11 LAB — POCT RAPID STREP A (OFFICE): Rapid Strep A Screen: NEGATIVE

## 2023-07-11 MED ORDER — AMOXICILLIN-POT CLAVULANATE 875-125 MG PO TABS: 1.0000 | ORAL_TABLET | Freq: Two times a day (BID) | ORAL | 0 refills | Status: DC

## 2023-07-11 NOTE — Assessment & Plan Note (Signed)
Vague URI symptoms; primary complaints is in throat due to increased swelling of tonsils  Recommend oral treatment given breadth of symptoms and young child at home

## 2023-07-11 NOTE — Assessment & Plan Note (Signed)
POC Strep negative; however, L AOM noted on exam and R ear edema in setting of acute tonsil enlargement

## 2023-07-11 NOTE — Patient Instructions (Signed)
Some things that can make you feel better are: - Increased rest - Increasing Fluids - Acetaminophen / ibuprofen as needed for fever/pain.  - Salt water gargling, chloraseptic spray and throat lozenges - OTC pseudoephedrine.  - Mucinex.  - Saline sinus flushes or a neti pot.  - Humidifying the air.  

## 2023-07-11 NOTE — Progress Notes (Signed)
Established patient visit   Patient: Angela Nunez   DOB: 02/05/1991   32 y.o. Female  MRN: [id number] Visit Date: 07/11/2023  Today's healthcare provider: Jacky Kindle, FNP  Introduced to nurse practitioner role and practice setting.  All questions answered.  Discussed provider/patient relationship and expectations.  Subjective    HPI HPI     Medical Management of Chronic Issues    Additional comments: Possible strep throat, mild symptoms Saturday and became worse yesterday, scratchy throat, ear ache in right ear, muscle ache in neck, left side, Possible high blood pressure       Last edited by Rolly Salter, CMA on 07/11/2023  8:44 AM.      Medications: Outpatient Medications Prior to Visit  Medication Sig   hydrOXYzine (ATARAX) 25 MG tablet Take 1 tablet (25 mg total) by mouth 3 (three) times daily as needed for anxiety.   levonorgestrel ([name]) 20 MCG/24HR IUD 1 Intra Uterine Device (1 each total) by Intrauterine route once for 1 dose.   [DISCONTINUED] ibuprofen (ADVIL) 800 MG tablet Take 1 tablet (800 mg total) by mouth every 8 (eight) hours as needed for mild pain or moderate pain.   No facility-administered medications prior to visit.    Review of Systems    Objective    BP (!) 139/99 (BP Location: Left Arm, Patient Position: Sitting, Cuff Size: Large)   Pulse (!) 107   Ht 5\' 1"  (1.549 m)   Wt 180 lb (81.6 kg)   SpO2 100%   BMI 34.01 kg/m   Physical Exam Vitals and nursing note reviewed.  Constitutional:      General: She is not in acute distress.    Appearance: Normal appearance. She is obese. She is ill-appearing. She is not toxic-appearing or diaphoretic.  HENT:     Head: Normocephalic and atraumatic.     Right Ear: Swelling present.     Left Ear: A middle ear effusion is present. Tympanic membrane is erythematous.     Mouth/Throat:     Mouth: Mucous membranes are moist.     Pharynx: Oropharynx is clear. Posterior oropharyngeal erythema  present.     Tonsils: No tonsillar exudate or tonsillar abscesses. 3+ on the right. 3+ on the left.  Eyes:     Extraocular Movements: Extraocular movements intact.     Conjunctiva/sclera: Conjunctivae normal.  Cardiovascular:     Rate and Rhythm: Normal rate and regular rhythm.     Pulses: Normal pulses.     Heart sounds: Normal heart sounds. No murmur heard.    No friction rub. No gallop.  Pulmonary:     Effort: Pulmonary effort is normal. No respiratory distress.     Breath sounds: Normal breath sounds. No stridor. No wheezing, rhonchi or rales.  Chest:     Chest wall: No tenderness.  Musculoskeletal:        General: No swelling, tenderness, deformity or signs of injury. Normal range of motion.     Right lower leg: No edema.     Left lower leg: No edema.  Skin:    General: Skin is warm and dry.     Capillary Refill: Capillary refill takes less than 2 seconds.     Coloration: Skin is not jaundiced or pale.     Findings: No bruising, erythema, lesion or rash.  Neurological:     General: No focal deficit present.     Mental Status: She is alert and oriented to person, place, and time.  Mental status is at baseline.     Cranial Nerves: No cranial nerve deficit.     Sensory: No sensory deficit.     Motor: No weakness.     Coordination: Coordination normal.  Psychiatric:        Mood and Affect: Mood normal.        Behavior: Behavior normal.        Thought Content: Thought content normal.        Judgment: Judgment normal.     Results for orders placed or performed in visit on 07/11/23  POCT rapid strep A  Result Value Ref Range   Rapid Strep A Screen Negative Negative    Assessment & Plan     Problem List Items Addressed This Visit       Respiratory   Tonsillitis - Primary    POC Strep negative; however, L AOM noted on exam and R ear edema in setting of acute tonsil enlargement       Relevant Orders   POCT rapid strep A (Completed)     Nervous and Auditory    Non-recurrent acute suppurative otitis media of left ear without spontaneous rupture of tympanic membrane    Vague URI symptoms; primary complaints is in throat due to increased swelling of tonsils  Recommend oral treatment given breadth of symptoms and young child at home       Relevant Medications   amoxicillin-clavulanate (AUGMENTIN) 875-125 MG tablet     Other   Elevated blood pressure reading in office without diagnosis of hypertension    Goal <140/<90 Recommend BP recheck when patient is not ill Continue to monitor as previously stable.       Return if symptoms worsen or fail to improve.     Leilani Merl, FNP, have reviewed all documentation for this visit. The documentation on 07/11/23 for the exam, diagnosis, procedures, and orders are all accurate and complete.  Jacky Kindle, FNP  [facility] [phone] (phone) [phone] (fax)  [facility]

## 2023-07-11 NOTE — Assessment & Plan Note (Signed)
Goal <140/<90 Recommend BP recheck when patient is not ill Continue to monitor as previously stable.

## 2023-10-12 ENCOUNTER — Encounter: Payer: Self-pay | Admitting: Family Medicine

## 2023-10-12 ENCOUNTER — Encounter: Payer: Self-pay | Admitting: Physician Assistant

## 2023-10-24 ENCOUNTER — Encounter: Payer: Self-pay | Admitting: Family Medicine

## 2023-10-24 ENCOUNTER — Ambulatory Visit: Payer: No Typology Code available for payment source | Admitting: Family Medicine

## 2023-10-24 VITALS — BP 137/82 | HR 109 | Resp 18 | Ht 61.0 in | Wt 180.5 lb

## 2023-10-24 DIAGNOSIS — R030 Elevated blood-pressure reading, without diagnosis of hypertension: Secondary | ICD-10-CM | POA: Diagnosis not present

## 2023-10-24 DIAGNOSIS — E66811 Obesity, class 1: Secondary | ICD-10-CM | POA: Diagnosis not present

## 2023-10-24 NOTE — Assessment & Plan Note (Addendum)
White Coat Hypertension VS. Primary Hypertension  Concerns for elevated BP in office and at recent dentist appt.  At home readings normotensive - 100s-110/ 70s.- reassuring.  Will check lipid panel - pt fasting today- to assess CVD risk Educated on continuing to monitor at home 2-3x/week- am and pm. Please bring home BP cuff in to compare measurements at annual physical.  Establishing lifestyle modifications. Educated on DASH diet, low sodium, decreasing processed foods.

## 2023-10-24 NOTE — Assessment & Plan Note (Addendum)
BMI 34.11 - weight at 180lb, consistent over last three years.  Will check A1C and CMP to assess for diabetes and kidney function given pt's concern for elevated BP today.  Exercise - moderate - 150 minutes/ week encouraged.  Will f/u with results.  Discussed weight loss.

## 2023-10-24 NOTE — Progress Notes (Signed)
Acute Office Visit  Introduced to nurse practitioner role and practice setting.  All questions answered.  Discussed provider/patient relationship and expectations.   Subjective:     Patient ID: Angela Nunez, female    DOB: 1991-10-12, 32 y.o.   MRN: [id number]  Chief Complaint  Patient presents with   Elevated Blood Pressure    HPI Angela Nunez comes in for concern for elevated BP reading at dentist office last week - SBP reading in 150s. States she does not have a hx of hypertension, but tends to have high BP at health care visits. She bought at home upper arm cuff, which she is doing daily and recording results via phone app. Initially readings were 130s/90s for first two days, and then over the last 7 days readings were 100s-110s/70s consistently.  She denies any end organ symptoms. No chest pain, vision changes, headaches or kidney changes. Mom and Dad have HTN. States she had child three years ago, BP was normal throughout pregnancy.  Hypertension: Patient is here for evaluation of elevated blood pressures.  Age at onset of elevated blood pressure:  32.Cardiac symptoms none. Patient denies none.  Cardiovascular risk factors: obesity (BMI >= 30 kg/m2). Use of agents associated with hypertension: none. History of target organ damage: none.   ROS  Negative unless stated in HPI.    Objective:    BP 137/82   Pulse (!) 109   Resp 18   Ht 5\' 1"  (1.549 m)   Wt 180 lb 8 oz (81.9 kg)   SpO2 99%   BMI 34.11 kg/m  BP Readings from Last 3 Encounters:  10/24/23 137/82  07/11/23 (!) 139/99  12/30/22 137/86     Physical Exam Constitutional:      Appearance: Normal appearance.  HENT:     Head: Normocephalic.  Eyes:     Pupils: Pupils are equal, round, and reactive to light.  Cardiovascular:     Rate and Rhythm: Normal rate and regular rhythm.     Pulses: Normal pulses.     Heart sounds: Normal heart sounds.  Pulmonary:     Effort: Pulmonary effort is normal.     Breath sounds:  Normal breath sounds.  Musculoskeletal:     Cervical back: Normal range of motion.  Skin:    General: Skin is warm and dry.     Capillary Refill: Capillary refill takes less than 2 seconds.  Neurological:     General: No focal deficit present.     Mental Status: She is alert and oriented to person, place, and time. Mental status is at baseline.  Psychiatric:        Mood and Affect: Mood normal.        Behavior: Behavior normal.        Thought Content: Thought content normal.    No results found for any visits on 10/24/23.      Assessment & Plan:   Problem List Items Addressed This Visit       Other   Elevated blood pressure reading in office without diagnosis of hypertension - Primary    Concerns for elevated BP in office and at recent dentist appt.  At home readings normotensive - 100s-110/ 70s.- reassuring.  Will check lipid panel - pt fasting today- to assess CVD risk Educated on continuing to monitor at home 2-3x/week- am and pm Please bring home BP cuff in to compare measurements at annual physical.  Educated on DASH diet, low sodium, decreasing processed foods.  Relevant Orders   Lipid Profile   Obesity (BMI 30.0-34.9)    BMI 34.11 - weight at 180lb, consistent over last three years.  Will check A1C and CMP to assess for diabetes and kidney function given pt's concern for elevated BP today.  Exercise - moderate - 150 minutes/ week encouraged.  Will f/u with results.  Discussed weight loss.       Relevant Orders   HgB A1c   Comprehensive Metabolic Panel (CMET)     I, Angela Provencal, FNP, have reviewed all documentation for this visit. The documentation on 10/24/23 for the exam, diagnosis, procedures, and orders are all accurate and complete.   Return in about 2 months (around 12/25/2023) for annual physical. Will reassess BP as well, please bring home BP cuff for comparison.   Angela Provencal, FNP

## 2023-10-25 LAB — COMPREHENSIVE METABOLIC PANEL
ALT: 22 [IU]/L (ref 0–32)
AST: 21 [IU]/L (ref 0–40)
Albumin: 4.5 g/dL (ref 4.0–5.0)
Alkaline Phosphatase: 82 [IU]/L (ref 44–121)
BUN/Creatinine Ratio: 9 (ref 9–23)
BUN: 7 mg/dL (ref 6–20)
Bilirubin Total: 0.4 mg/dL (ref 0.0–1.2)
CO2: 25 mmol/L (ref 20–29)
Calcium: 9.4 mg/dL (ref 8.7–10.2)
Chloride: 107 mmol/L — ABNORMAL HIGH (ref 96–106)
Creatinine, Ser: 0.75 mg/dL (ref 0.57–1.00)
Globulin, Total: 2.6 g/dL (ref 1.5–4.5)
Glucose: 91 mg/dL (ref 70–99)
Potassium: 4.9 mmol/L (ref 3.5–5.2)
Sodium: 144 mmol/L (ref 134–144)
Total Protein: 7.1 g/dL (ref 6.0–8.5)
eGFR: 110 mL/min/{1.73_m2} (ref >59)

## 2023-10-25 LAB — LIPID PANEL
Chol/HDL Ratio: 5.2 {ratio} — ABNORMAL HIGH (ref 0.0–4.4)
Cholesterol, Total: 194 mg/dL (ref 100–199)
HDL: 37 mg/dL — ABNORMAL LOW (ref >39)
LDL Chol Calc (NIH): 129 mg/dL — ABNORMAL HIGH (ref 0–99)
Triglycerides: 155 mg/dL — ABNORMAL HIGH (ref 0–149)
VLDL Cholesterol Cal: 28 mg/dL (ref 5–40)

## 2023-10-25 LAB — HEMOGLOBIN A1C
Est. average glucose Bld gHb Est-mCnc: 103 mg/dL
Hgb A1c MFr Bld: 5.2 % (ref 4.8–5.6)

## 2023-10-25 NOTE — Progress Notes (Signed)
Hemoglobin A1C - normal Comprehensive metabolic panel, subtly elevated chloride - most likely mild dehydration during lab drawn- other kidney function and electrolytes normal. Lipid panel - fasting - overall cholesterol normal <200; LDL elevated, as well as Triglycerides. Results appear to be similar to February 2023 results.

## 2023-12-08 ENCOUNTER — Ambulatory Visit
Admission: EM | Admit: 2023-12-08 | Discharge: 2023-12-08 | Disposition: A | Discharge status: Home / Self Care | Payer: No Typology Code available for payment source | Attending: Emergency Medicine | Admitting: Emergency Medicine

## 2023-12-08 ENCOUNTER — Encounter: Payer: Self-pay | Admitting: *Deleted

## 2023-12-08 DIAGNOSIS — F411 Generalized anxiety disorder: Secondary | ICD-10-CM | POA: Diagnosis not present

## 2023-12-08 DIAGNOSIS — R002 Palpitations: Secondary | ICD-10-CM | POA: Diagnosis not present

## 2023-12-08 DIAGNOSIS — R000 Tachycardia, unspecified: Secondary | ICD-10-CM

## 2023-12-08 DIAGNOSIS — R42 Dizziness and giddiness: Secondary | ICD-10-CM | POA: Diagnosis not present

## 2023-12-08 HISTORY — DX: Anxiety disorder, unspecified: F41.9

## 2023-12-08 NOTE — ED Provider Notes (Signed)
Renaldo Fiddler    CSN: [id number] Arrival date & time: 12/08/23  1921      History   Chief Complaint Chief Complaint  Patient presents with   Chest Pain    HPI Angela Nunez is a 33 y.o. female.  Accompanied by her mother, patient presents with concern for episode of elevated heart rate.  Patient had a bowel movement this evening; As she was walking back to her living room, she felt lightheaded; She sat down and noticed that her heart rate was elevated; Her smart watch showed her heart rate was 170.  This episode resolved spontaneously in less than 5 minutes.  Earlier today she had pain in her left rib cage which has now moved to her left mid back.  She denies fever, cough, shortness of breath, chest pain.  She feels anxious and has a history of anxiety.  Patient and her mother went to [facility] ED earlier but left due to the wait time.  Patient's medical history includes elevated blood pressure readings, hyperlipidemia, obesity.  The history is provided by the patient, a parent and medical records.    Past Medical History:  Diagnosis Date   Anxiety    Hypothyroidism    Vaccine for human papilloma virus (HPV) types 6, 11, 16, and 18 administered     Patient Active Problem List   Diagnosis Date Noted   Obesity (BMI 30.0-34.9) 10/24/2023   Tonsillitis 07/11/2023   Non-recurrent acute suppurative otitis media of left ear without spontaneous rupture of tympanic membrane 07/11/2023   Elevated blood pressure reading in office without diagnosis of hypertension 07/11/2023   Moderate mixed hyperlipidemia not requiring statin therapy 12/30/2022   RUQ pain 07/20/2022   Avitaminosis D 12/29/2021   Fatigue 07/31/2021   Anxiety 01/10/2019    Past Surgical History:  Procedure Laterality Date   CHOLECYSTECTOMY  07/2022   WISDOM TOOTH EXTRACTION     x4 extracted     OB History     Gravida  1   Para  1   Term  1   Preterm  0   AB  0   Living  1      SAB  0    IAB  0   Ectopic  0   Multiple  0   Live Births  1            Home Medications    Prior to Admission medications   Medication Sig Start Date End Date Taking? Authorizing Provider  hydrOXYzine (ATARAX) 25 MG tablet Take 1 tablet (25 mg total) by mouth 3 (three) times daily as needed for anxiety. 12/30/22   Alfredia Ferguson, PA-C  levonorgestrel ([name]) 20 MCG/24HR IUD 1 Intra Uterine Device (1 each total) by Intrauterine route once for 1 dose. 08/06/20 10/24/23  Copland, Ilona Sorrel, PA-C    Family History Family History  Problem Relation Age of Onset   Hypertension Mother    Hypertension Father    Breast cancer Paternal Grandmother 50 - 44   Breast cancer Other     Social History Social History   Tobacco Use   Smoking status: Never   Smokeless tobacco: Never  Vaping Use   Vaping status: Never Used  Substance Use Topics   Alcohol use: Not Currently    Comment: Once or twice a month   Drug use: No     Allergies   Patient has no known allergies.   Review of Systems Review of Systems  Constitutional:  Negative for chills and fever.  Respiratory:  Negative for cough and shortness of breath.   Cardiovascular:  Positive for palpitations. Negative for chest pain.  Gastrointestinal:  Negative for abdominal pain, nausea and vomiting.  Musculoskeletal:  Positive for back pain. Negative for gait problem.  Skin:  Negative for color change, rash and wound.  Neurological:  Negative for weakness and numbness.     Physical Exam Triage Vital Signs ED Triage Vitals  Encounter Vitals Group     BP      Systolic BP Percentile      Diastolic BP Percentile      Pulse      Resp      Temp      Temp src      SpO2      Weight      Height      Head Circumference      Peak Flow      Pain Score      Pain Loc      Pain Education      Exclude from Growth Chart    No data found.  Updated Vital Signs BP 137/87 (BP Location: Left Arm)   Pulse (!) 110   Temp 97.6 F (36.4  C)   Resp 18   Ht 5\' 1"  (1.549 m)   Wt 178 lb (80.7 kg)   LMP 11/10/2023 (Exact Date)   SpO2 98%   BMI 33.63 kg/m   Visual Acuity Right Eye Distance:   Left Eye Distance:   Bilateral Distance:    Right Eye Near:   Left Eye Near:    Bilateral Near:     Physical Exam Constitutional:      General: She is not in acute distress. HENT:     Mouth/Throat:     Mouth: Mucous membranes are moist.  Cardiovascular:     Rate and Rhythm: Regular rhythm. Tachycardia present.     Heart sounds: Normal heart sounds.  Pulmonary:     Effort: Pulmonary effort is normal. No respiratory distress.     Breath sounds: Normal breath sounds.  Abdominal:     General: Bowel sounds are normal.     Palpations: Abdomen is soft.     Tenderness: There is no abdominal tenderness. There is no guarding or rebound.  Skin:    General: Skin is warm and dry.     Findings: No bruising, erythema, lesion or rash.  Neurological:     General: No focal deficit present.     Mental Status: She is alert and oriented to person, place, and time.     Sensory: No sensory deficit.     Motor: No weakness.     Gait: Gait normal.  Psychiatric:        Attention and Perception: Attention normal.        Mood and Affect: Mood is anxious.        Speech: Speech normal.        Behavior: Behavior normal. Behavior is cooperative.        Thought Content: Thought content normal.      UC Treatments / Results  Labs (all labs ordered are listed, but only abnormal results are displayed) Labs Reviewed - No data to display  EKG   Radiology No results found.  Procedures Procedures (including critical care time)  Medications Ordered in UC Medications - No data to display  Initial Impression / Assessment and Plan / UC Course  I have reviewed the  triage vital signs and the nursing notes.  Pertinent labs & imaging results that were available during my care of the patient were reviewed by me and considered in my medical  decision making (see chart for details).    Heart palpitations, dizziness, anxiety.  Discussed limitations of evaluation of her symptoms and in an urgent care setting.  Patient declines transfer to the ED at this time.  EKG shows sinus tachycardia, heart rate 116, no ST elevation, no previous to compare.  Instructed patient to follow-up with her PCP tomorrow.  Strict ED precautions given.  Education provided on chest pain, heart palpitations, sinus tachycardia.  Patient agrees to plan of care.    Final Clinical Impressions(s) / UC Diagnoses   Final diagnoses:  Heart palpitations  Dizziness  Tachycardia  Anxiety state     Discharge Instructions      Follow up with your primary care provider tomorrow.    Go to the emergency department if you have worsening symptoms.         ED Prescriptions   None    PDMP not reviewed this encounter.   Mickie Bail, NP 12/08/23 2011

## 2023-12-08 NOTE — Discharge Instructions (Addendum)
Follow up with your primary care provider tomorrow.  Go to the emergency department if you have worsening symptoms.

## 2023-12-08 NOTE — ED Triage Notes (Signed)
Patient states she started having left sided rib/under arm pain that has now moved to mid back.  States she has anxiety and it may be that but she was afraid to try her anxiety med before making sure everything was ok

## 2023-12-09 ENCOUNTER — Telehealth: Payer: Self-pay | Admitting: Family Medicine

## 2023-12-09 NOTE — Telephone Encounter (Signed)
Appt has been scheduled with Dr.Fisher for 12/13/22. Please verify if appointment with Caryl Asp is still preferred

## 2023-12-09 NOTE — Telephone Encounter (Signed)
Pt is calling to schedule follow up with Kellie from the UC yesterday. Pt went to the UC for Heart palpitations +3 more . Apt scheduled 12/14/23 with Dr. Sherrie Mustache

## 2023-12-14 ENCOUNTER — Encounter: Payer: Self-pay | Admitting: *Deleted

## 2023-12-14 ENCOUNTER — Telehealth: Payer: No Typology Code available for payment source | Admitting: Family Medicine

## 2023-12-14 ENCOUNTER — Other Ambulatory Visit: Payer: Self-pay

## 2023-12-14 ENCOUNTER — Emergency Department: Payer: No Typology Code available for payment source

## 2023-12-14 DIAGNOSIS — E039 Hypothyroidism, unspecified: Secondary | ICD-10-CM | POA: Diagnosis not present

## 2023-12-14 DIAGNOSIS — R000 Tachycardia, unspecified: Secondary | ICD-10-CM | POA: Diagnosis present

## 2023-12-14 DIAGNOSIS — R002 Palpitations: Secondary | ICD-10-CM | POA: Diagnosis not present

## 2023-12-14 DIAGNOSIS — R0789 Other chest pain: Secondary | ICD-10-CM | POA: Diagnosis not present

## 2023-12-14 LAB — TSH: TSH: 6.318 u[IU]/mL — ABNORMAL HIGH (ref 0.350–4.500)

## 2023-12-14 LAB — BASIC METABOLIC PANEL
Anion gap: 15 (ref 5–15)
BUN: 13 mg/dL (ref 6–20)
CO2: 24 mmol/L (ref 22–32)
Calcium: 9.4 mg/dL (ref 8.9–10.3)
Chloride: 103 mmol/L (ref 98–111)
Creatinine, Ser: 0.9 mg/dL (ref 0.44–1.00)
GFR, Estimated: >60 mL/min (ref >60)
Glucose, Bld: 103 mg/dL — ABNORMAL HIGH (ref 70–99)
Potassium: 3.6 mmol/L (ref 3.5–5.1)
Sodium: 142 mmol/L (ref 135–145)

## 2023-12-14 LAB — MAGNESIUM: Magnesium: 2 mg/dL (ref 1.7–2.4)

## 2023-12-14 LAB — CBC
HCT: 44.6 % (ref 36.0–46.0)
Hemoglobin: 15.3 g/dL — ABNORMAL HIGH (ref 12.0–15.0)
MCH: 28.9 pg (ref 26.0–34.0)
MCHC: 34.3 g/dL (ref 30.0–36.0)
MCV: 84.2 fL (ref 80.0–100.0)
Platelets: 247 10*3/uL (ref 150–400)
RBC: 5.3 MIL/uL — ABNORMAL HIGH (ref 3.87–5.11)
RDW: 11.9 % (ref 11.5–15.5)
WBC: 9.3 10*3/uL (ref 4.0–10.5)
nRBC: 0.2 % (ref 0.0–0.2)

## 2023-12-14 LAB — TROPONIN I (HIGH SENSITIVITY): Troponin I (High Sensitivity): <2 ng/L (ref <18)

## 2023-12-14 NOTE — ED Triage Notes (Signed)
Pt ambulatory to triage.  Pt was sitting on the sofa and felt her heart beating fast.  No chest pain or sob.  No sob.  Hx tachycardia.  Similar sx last week.  Pt alert  speech clear.

## 2023-12-15 ENCOUNTER — Emergency Department
Admission: EM | Admit: 2023-12-15 | Discharge: 2023-12-15 | Disposition: A | Discharge status: Home / Self Care | Payer: No Typology Code available for payment source | Attending: Emergency Medicine | Admitting: Emergency Medicine

## 2023-12-15 DIAGNOSIS — R000 Tachycardia, unspecified: Secondary | ICD-10-CM

## 2023-12-15 LAB — HCG, QUANTITATIVE, PREGNANCY: hCG, Beta Chain, Quant, S: <1 m[IU]/mL (ref <5)

## 2023-12-15 LAB — T4, FREE: Free T4: 0.88 ng/dL (ref 0.61–1.12)

## 2023-12-15 LAB — TROPONIN I (HIGH SENSITIVITY): Troponin I (High Sensitivity): <2 ng/L (ref <18)

## 2023-12-15 LAB — D-DIMER, QUANTITATIVE: D-Dimer, Quant: <0.27 ug{FEU}/mL (ref 0.00–0.50)

## 2023-12-15 MED ORDER — LORAZEPAM 2 MG/ML IJ SOLN
1.0000 mg | Freq: Once | INTRAMUSCULAR | Status: AC
  Administered 2023-12-15: 1 mg via INTRAVENOUS
  Filled 2023-12-15: qty 1

## 2023-12-15 MED ORDER — SODIUM CHLORIDE 0.9 % IV BOLUS (SEPSIS)
1000.0000 mL | Freq: Once | INTRAVENOUS | Status: AC
  Administered 2023-12-15: 1000 mL via INTRAVENOUS

## 2023-12-15 NOTE — ED Provider Notes (Signed)
[facility] Provider Note    Event Date/Time   First MD Initiated Contact with Patient 12/15/23 [phone]     (approximate)   History   Tachycardia   HPI  Angela Nunez is a 33 y.o. female with history of hypothyroidism, anxiety who presents to the emergency department with palpitations.  She has had intermittent symptoms this week.  She states symptoms started tonight while she was sitting on the couch.  Feels like her heart is racing and this causes some chest tightness.  No shortness of breath, fevers, cough, vomiting, diarrhea.  No history of PE or DVT.  No history of POTS.  She states her sister has a history of sinus tachycardia and is on metoprolol.  Patient does not know what her baseline heart rate and blood pressure are.  She has a PCP and has an appointment to see them tomorrow.  She states that she does not think that anxiety is what starts her symptoms but she definitely thinks that it makes her symptoms worse.  She has a prescription for Vistaril but she did not take it today as she states it makes her feel very drowsy.  No illicit drug use.   History provided by patient, husband.    Past Medical History:  Diagnosis Date   Anxiety    Hypothyroidism    Vaccine for human papilloma virus (HPV) types 6, 11, 16, and 18 administered     Past Surgical History:  Procedure Laterality Date   CHOLECYSTECTOMY  07/2022   WISDOM TOOTH EXTRACTION     x4 extracted     MEDICATIONS:  Prior to Admission medications   Medication Sig Start Date End Date Taking? Authorizing Provider  hydrOXYzine (ATARAX) 25 MG tablet Take 1 tablet (25 mg total) by mouth 3 (three) times daily as needed for anxiety. 12/30/22   Alfredia Ferguson, PA-C  levonorgestrel ([name]) 20 MCG/24HR IUD 1 Intra Uterine Device (1 each total) by Intrauterine route once for 1 dose. 08/06/20 10/24/23  CoplandIlona Sorrel, PA-C    Physical Exam   Triage Vital Signs: ED Triage Vitals  Encounter  Vitals Group     BP 12/14/23 2147 (!) 163/100     Systolic BP Percentile --      Diastolic BP Percentile --      Pulse Rate 12/14/23 2147 (!) 147     Resp 12/14/23 2147 18     Temp 12/14/23 2147 98.9 F (37.2 C)     Temp Source 12/14/23 2147 Oral     SpO2 12/14/23 2147 100 %     Weight 12/14/23 2139 178 lb (80.7 kg)     Height 12/14/23 2139 5\' 1"  (1.549 m)     Head Circumference --      Peak Flow --      Pain Score 12/14/23 2139 0     Pain Loc --      Pain Education --      Exclude from Growth Chart --     Most recent vital signs: Vitals:   12/15/23 0220 12/15/23 0245  BP:    Pulse: 93 100  Resp:    Temp:    SpO2:      CONSTITUTIONAL: Alert, responds appropriately to questions.  Appears anxious HEAD: Normocephalic, atraumatic EYES: Conjunctivae clear, pupils appear equal, sclera nonicteric ENT: normal nose; moist mucous membranes NECK: Supple, normal ROM CARD: Regular and tachycardic; S1 and S2 appreciated RESP: Normal chest excursion without splinting or tachypnea; breath sounds  clear and equal bilaterally; no wheezes, no rhonchi, no rales, no hypoxia or respiratory distress, speaking full sentences ABD/GI: Non-distended; soft, non-tender, no rebound, no guarding, no peritoneal signs BACK: The back appears normal EXT: Normal ROM in all joints; no deformity noted, no edema, no calf tenderness or calf swelling SKIN: Normal color for age and race; warm; no rash on exposed skin NEURO: Moves all extremities equally, normal speech PSYCH: The patient's mood and manner are appropriate.   ED Results / Procedures / Treatments   LABS: (all labs ordered are listed, but only abnormal results are displayed) Labs Reviewed  BASIC METABOLIC PANEL - Abnormal; Notable for the following components:      Result Value   Glucose, Bld 103 (*)    All other components within normal limits  CBC - Abnormal; Notable for the following components:   RBC 5.30 (*)    Hemoglobin 15.3 (*)     All other components within normal limits  TSH - Abnormal; Notable for the following components:   TSH 6.318 (*)    All other components within normal limits  HCG, QUANTITATIVE, PREGNANCY  MAGNESIUM  D-DIMER, QUANTITATIVE  T4, FREE  POC URINE PREG, ED  TROPONIN I (HIGH SENSITIVITY)  TROPONIN I (HIGH SENSITIVITY)     EKG:  EKG Interpretation Date/Time:  Wednesday December 14 2023 23:19:39 EST Ventricular Rate:  123 PR Interval:  164 QRS Duration:  86 QT Interval:  308 QTC Calculation: 440 R Axis:   65  Text Interpretation: Sinus tachycardia Nonspecific ST and T wave abnormality Abnormal ECG When compared with ECG of 14-Dec-2023 21:46, (unconfirmed) No significant change was found Confirmed by Rochele Raring [id number]) on 12/15/2023 12:42:28 AM         RADIOLOGY: My personal review and interpretation of imaging: Chest x-ray clear.  I have personally reviewed all radiology reports.   DG Chest 2 View Result Date: 12/14/2023 CLINICAL DATA:  Tachycardia EXAM: CHEST - 2 VIEW COMPARISON:  None Available. FINDINGS: The heart size and mediastinal contours are within normal limits. Both lungs are clear. The visualized skeletal structures are unremarkable. IMPRESSION: No active cardiopulmonary disease. Electronically Signed   By: Helyn Numbers M.D.   On: 12/14/2023 22:05     PROCEDURES:  Critical Care performed: No     .1-3 Lead EKG Interpretation  Performed by: Zeki Bedrosian, Layla Maw, DO Authorized by: Kaydan Wong, Layla Maw, DO     Interpretation: abnormal     ECG rate:  120   ECG rate assessment: tachycardic     Rhythm: sinus tachycardia     Ectopy: none     Conduction: normal       IMPRESSION / MDM / ASSESSMENT AND PLAN / ED COURSE  I reviewed the triage vital signs and the nursing notes.    Patient here with palpitations, sinus tachycardia.  The patient is on the cardiac monitor to evaluate for evidence of arrhythmia and/or significant heart rate changes.   DIFFERENTIAL  DIAGNOSIS (includes but not limited to):   Anxiety, dehydration, anemia, electrolyte derangement, thyroid dysfunction, arrhythmia, PE, less likely ACS, dissection, pneumonia, pneumothorax   Patient's presentation is most consistent with acute presentation with potential threat to life or bodily function.   PLAN: EKG nonischemic.  Patient in a sinus tachycardia.  No arrhythmia seen on cardiac monitoring.  Will continue to closely monitor.  Labs obtained from triage.  Patient has normal hemoglobin.  Normal electrolytes.  First troponin negative.  Second pending.  Will also check thyroid function  test given history of hypothyroidism, D-dimer.  Will give IV fluids, Ativan for symptomatic relief.   MEDICATIONS GIVEN IN ED: Medications  sodium chloride 0.9 % bolus 1,000 mL (0 mLs Intravenous Stopped 12/15/23 0242)  LORazepam (ATIVAN) injection 1 mg (1 mg Intravenous Given 12/15/23 0129)     ED COURSE: Second troponin negative.  D-dimer negative.  Pregnancy test negative.  TSH elevated but free T4 normal.  Chest x-ray reviewed and interpreted by myself and the radiologist and is clear.  Heart rate is now down into the 90s and blood pressure has also improved.  Patient appears more comfortable.  Have advised her to take her Vistaril at home as prescribed when she needs it as I think this will help her symptoms.  Recommended increase water intake, avoiding caffeine and close follow-up with her PCP as already scheduled tomorrow.  No events noted on cardiac monitoring.  She has not had any abnormal rhythm.  She has been in sinus tachycardia here.  She ask about being started on a beta-blocker.  Discussed with patient that I would like this to be followed by her primary care doctor as we are not sure what her baseline heart rate and blood pressure normally are when she is feeling well and that these medications could be dangerous to her if her blood pressure and heart rate are normally already well-controlled.   She verbalized understanding.   At this time, I do not feel there is any life-threatening condition present. I reviewed all nursing notes, vitals, pertinent previous records.  All lab and urine results, EKGs, imaging ordered have been independently reviewed and interpreted by myself.  I reviewed all available radiology reports from any imaging ordered this visit.  Based on my assessment, I feel the patient is safe to be discharged home without further emergent workup and can continue workup as an outpatient as needed. Discussed all findings, treatment plan as well as usual and customary return precautions.  They verbalize understanding and are comfortable with this plan.  Outpatient follow-up has been provided as needed.  All questions have been answered.    CONSULTS:  none   OUTSIDE RECORDS REVIEWED: Reviewed last PCP note on 10/24/2023.       FINAL CLINICAL IMPRESSION(S) / ED DIAGNOSES   Final diagnoses:  Sinus tachycardia     Rx / DC Orders   ED Discharge Orders     None        Note:  This document was prepared using Dragon voice recognition software and may include unintentional dictation errors.   Finnley Lewis, Layla Maw, DO 12/15/23 [phone]

## 2023-12-16 ENCOUNTER — Ambulatory Visit: Payer: No Typology Code available for payment source | Admitting: Family Medicine

## 2023-12-16 ENCOUNTER — Ambulatory Visit: Payer: No Typology Code available for payment source | Attending: Family Medicine

## 2023-12-16 VITALS — BP 139/105 | HR 116 | Ht 61.0 in | Wt 175.0 lb

## 2023-12-16 DIAGNOSIS — R000 Tachycardia, unspecified: Secondary | ICD-10-CM

## 2023-12-16 DIAGNOSIS — R7989 Other specified abnormal findings of blood chemistry: Secondary | ICD-10-CM | POA: Insufficient documentation

## 2023-12-16 DIAGNOSIS — F419 Anxiety disorder, unspecified: Secondary | ICD-10-CM

## 2023-12-16 DIAGNOSIS — R030 Elevated blood-pressure reading, without diagnosis of hypertension: Secondary | ICD-10-CM | POA: Diagnosis not present

## 2023-12-16 MED ORDER — ESCITALOPRAM OXALATE 10 MG PO TABS: 10.0000 mg | ORAL_TABLET | Freq: Every day | ORAL | 1 refills | Status: DC

## 2023-12-16 MED ORDER — BUSPIRONE HCL 7.5 MG PO TABS: 7.5000 mg | ORAL_TABLET | Freq: Two times a day (BID) | ORAL | 0 refills | Status: AC | PRN

## 2023-12-16 NOTE — Assessment & Plan Note (Signed)
Elevated TSH on lab during ED visit, historically normal value Will plan to recheck in 4 weeks at mood/ CPE appt.  Maybe contributing to anxiety symptoms

## 2023-12-16 NOTE — Assessment & Plan Note (Signed)
Elevated today - most likely related to anxiety Home readings continue to be 110s/70s on average Low sodium diet, small frequent meals, exercise and weight loss

## 2023-12-16 NOTE — Assessment & Plan Note (Addendum)
Patient presents with palpitations and anxiety.  EKG in ED and urgent care shows sinus tachycardia.  Labs normal except for slightly elevated thyroid hormone on 12/14/23 HR today 90s Cardiac vs psychologically driven vs hormonal. Discussed valva maneuver, "blowing through straw", deep breathing, drinking ice water to decrease heart May continue to exercise as long as asymptomatic (lightheaded, faint, dizziness, numb/tingling, low BP) -Order 14-day at-home heart monitor to assess for any abnormalities over a longer period. -Consider beta blockers based on monitor findings and post starting SSRI for anxiety

## 2023-12-16 NOTE — Progress Notes (Signed)
Acute Office Visit  Subjective:     Patient ID: Angela Nunez, female    DOB: 08-03-1991, 33 y.o.   MRN: [id number]  Chief Complaint  Patient presents with   Follow-up    ED f/u--pt feeling much better, denied pain, but still anxious.    The patient, a 33 year old individual, presents for follow-up after recent emergency department (ED) visits due to palpitations and concerns for sinus tachycardia. The patient reports experiencing episodes of rapid heart rate, with one episode reaching 165-168 beats per minute, as measured by her apple watch. These episodes are associated with feelings of anxiety and feeling faint, no syncopal event.  In ED - EKG noted sinus tachycardia, labs were unremarkable other than subtle increase in TSH. Pt was given one liter bolus of fluid and 1mg  of ativan - symptoms resolved.   Resting HR typically 80s-90s, when she works out 120s-130s.  The patient also reports experiencing what she initially believed to be gas pains, which migrated from the chest to the back. This discomfort was associated with a sensation of impending faintness, which the patient attributes to anxiety. The patient has been monitoring her blood pressure at home, with readings reported as normal, although the patient acknowledges that anxiety may be contributing to elevated readings in the clinical setting.  The patient has been prescribed hydroxyzine, which she takes infrequently due to its sedative effects. The patient reports that this medication is effective in inducing sleep but is reluctant to take it during the day due to its sedative effects.  The patient also reports a history of thyroid issues, although recent lab results indicate only a slight elevation in thyroid-stimulating hormone (TSH) levels. The patient is unsure whether this elevation is related to her current symptoms or a separate issue.  The patient expresses a desire to resume physical activity, specifically using an  exercise bike, which was previously advised against until further medical consultation. The patient's goal is to help manage her blood pressure through physical activity and dietary improvements.  The patient expresses a high level of anxiety about her symptoms, particularly the fear of experiencing another episode of rapid heart rate. This anxiety appears to be contributing to the patient's overall distress and may be exacerbating her symptoms.      12/16/2023   10:40 AM 10/24/2023    8:30 AM  GAD 7 : Generalized Anxiety Score  Nervous, Anxious, on Edge 2 0  Control/stop worrying 1 0  Worry too much - different things 1 0  Trouble relaxing 1 0  Restless 0 0  Easily annoyed or irritable 0 0  Afraid - awful might happen 1 0  Total GAD 7 Score 6 0  Anxiety Difficulty Not difficult at all Not difficult at all      Review of Systems  All other systems reviewed and are negative.       Objective:    BP (!) 139/105 (BP Location: Left Arm, Patient Position: Sitting, Cuff Size: Normal)   Pulse (!) 116   Ht 5\' 1"  (1.549 m)   Wt 175 lb (79.4 kg)   LMP 12/12/2023   SpO2 97%   BMI 33.07 kg/m    Physical Exam Constitutional:      General: She is not in acute distress.    Appearance: Normal appearance. She is obese. She is not toxic-appearing or diaphoretic.  HENT:     Head: Normocephalic.  Eyes:     Extraocular Movements: Extraocular movements intact.  Pupils: Pupils are equal, round, and reactive to light.  Cardiovascular:     Rate and Rhythm: Normal rate and regular rhythm.     Pulses: Normal pulses.     Heart sounds: Normal heart sounds. No murmur heard.    No friction rub. No gallop.  Pulmonary:     Effort: No respiratory distress.     Breath sounds: No stridor. No wheezing, rhonchi or rales.  Chest:     Chest wall: No tenderness.  Musculoskeletal:     Right lower leg: No edema.     Left lower leg: No edema.  Skin:    General: Skin is warm and dry.     Capillary  Refill: Capillary refill takes less than 2 seconds.  Neurological:     General: No focal deficit present.     Mental Status: She is alert and oriented to person, place, and time. Mental status is at baseline.  Psychiatric:        Attention and Perception: Attention and perception normal.        Mood and Affect: Mood is anxious. Affect is tearful.        Speech: Speech is rapid and pressured.        Behavior: Behavior is hyperactive. Behavior is cooperative.        Thought Content: Thought content normal.        Cognition and Memory: Cognition and memory normal.        Judgment: Judgment normal.    No results found for any visits on 12/16/23.     Assessment & Plan:   Problem List Items Addressed This Visit       Other   Anxiety - Primary   Patient reports significant anxiety with panic and fear, which may be cause to sinus tachycardia and symptoms of faintness Pt has significant fears of regarding elevate HR and concerns of taking care of child Will continue to evaluate from a cardiac origin for tachycardiac, but pt also experiencing high levels of anxiety. Plan for daily anxiety medication for maintenance and as needed med. GAD7= 6 -Start Lexapro (citalopram) 10mg  daily for daily anxiety management. -Provide Buspar 7.5mg  twice daily as needed for acute anxiety episodes. -May continue as needed hydroxyzine 25 mg for anxiety       Relevant Medications   escitalopram (LEXAPRO) 10 MG tablet   busPIRone (BUSPAR) 7.5 MG tablet   Elevated blood pressure reading in office without diagnosis of hypertension   Elevated today - most likely related to anxiety Home readings continue to be 110s/70s on average Low sodium diet, small frequent meals, exercise and weight loss       Tachycardia   Patient presents with palpitations and anxiety.  EKG in ED and urgent care shows sinus tachycardia.  Labs normal except for slightly elevated thyroid hormone on 12/14/23 HR today 90s Cardiac vs  psychologically driven vs hormonal. Discussed valva maneuver, "blowing through straw", deep breathing, drinking ice water to decrease heart May continue to exercise as long as asymptomatic (lightheaded, faint, dizziness, numb/tingling, low BP) -Order 14-day at-home heart monitor to assess for any abnormalities over a longer period. -Consider beta blockers based on monitor findings and post starting SSRI for anxiety      Relevant Orders   LONG TERM MONITOR (3-14 DAYS)   Elevated TSH   Elevated TSH on lab during ED visit, historically normal value Will plan to recheck in 4 weeks at mood/ CPE appt.  Maybe contributing to anxiety symptoms  Meds ordered this encounter  Medications   escitalopram (LEXAPRO) 10 MG tablet    Sig: Take 1 tablet (10 mg total) by mouth daily.    Dispense:  30 tablet    Refill:  1   busPIRone (BUSPAR) 7.5 MG tablet    Sig: Take 1 tablet (7.5 mg total) by mouth 2 (two) times daily as needed.    Dispense:  60 tablet    Refill:  0    Return in about 4 weeks (around 01/13/2024).  I, Sallee Provencal, FNP, have reviewed all documentation for this visit. The documentation on 12/16/23 for the exam, diagnosis, procedures, and orders are all accurate and complete.   Sallee Provencal, FNP

## 2023-12-16 NOTE — Assessment & Plan Note (Signed)
Patient reports significant anxiety with panic and fear, which may be cause to sinus tachycardia and symptoms of faintness Pt has significant fears of regarding elevate HR and concerns of taking care of child Will continue to evaluate from a cardiac origin for tachycardiac, but pt also experiencing high levels of anxiety. Plan for daily anxiety medication for maintenance and as needed med. GAD7= 6 -Start Lexapro (citalopram) 10mg  daily for daily anxiety management. -Provide Buspar 7.5mg  twice daily as needed for acute anxiety episodes. -May continue as needed hydroxyzine 25 mg for anxiety

## 2023-12-17 ENCOUNTER — Encounter: Payer: Self-pay | Admitting: Family Medicine

## 2023-12-19 ENCOUNTER — Encounter: Payer: Self-pay | Admitting: Family Medicine

## 2024-01-04 ENCOUNTER — Encounter: Payer: No Typology Code available for payment source | Admitting: Family Medicine

## 2024-01-13 ENCOUNTER — Encounter: Payer: No Typology Code available for payment source | Admitting: Family Medicine

## 2024-01-16 ENCOUNTER — Encounter: Payer: Self-pay | Admitting: Family Medicine

## 2024-01-16 DIAGNOSIS — R000 Tachycardia, unspecified: Secondary | ICD-10-CM

## 2024-01-18 ENCOUNTER — Ambulatory Visit (INDEPENDENT_AMBULATORY_CARE_PROVIDER_SITE_OTHER): Payer: No Typology Code available for payment source | Admitting: Family Medicine

## 2024-01-18 ENCOUNTER — Encounter: Payer: Self-pay | Admitting: Family Medicine

## 2024-01-18 ENCOUNTER — Telehealth: Payer: Self-pay | Admitting: *Deleted

## 2024-01-18 VITALS — BP 133/89 | HR 112 | Ht 61.0 in | Wt 170.9 lb

## 2024-01-18 DIAGNOSIS — F419 Anxiety disorder, unspecified: Secondary | ICD-10-CM

## 2024-01-18 DIAGNOSIS — R000 Tachycardia, unspecified: Secondary | ICD-10-CM

## 2024-01-18 DIAGNOSIS — R030 Elevated blood-pressure reading, without diagnosis of hypertension: Secondary | ICD-10-CM

## 2024-01-18 DIAGNOSIS — E66811 Obesity, class 1: Secondary | ICD-10-CM | POA: Diagnosis not present

## 2024-01-18 DIAGNOSIS — Z0000 Encounter for general adult medical examination without abnormal findings: Secondary | ICD-10-CM

## 2024-01-18 DIAGNOSIS — Z0001 Encounter for general adult medical examination with abnormal findings: Secondary | ICD-10-CM | POA: Diagnosis not present

## 2024-01-18 NOTE — Assessment & Plan Note (Addendum)
 Down 5lbs since 12/16/23 visit; BMI 32.29 Continue to make efforts for weight loss Continue to make conscious decisions for well balanced diet smaller portions with increase protein, fruits, veggies, water as drink of choice, decrease starches, processed foods, and saturated fats. Increase weekly exercise - 150 minutes per week.

## 2024-01-18 NOTE — Progress Notes (Signed)
 Complete physical exam  Patient: Angela Nunez   DOB: 02/01/1991   32 y.o. Female  MRN: [id number]  Introduced to nurse practitioner role and practice setting.  All questions answered.  Discussed provider/patient relationship and expectations.   Subjective:    Chief Complaint  Patient presents with   Annual Exam    Angela Nunez is a 33 y.o. female who presents today for a complete physical exam. She reports consuming a general diet. The patient does not participate in regular exercise at present. She generally feels fairly well. She reports sleeping fairly well. She does have additional problems to discuss today.   She has a history of anxiety and is currently taking escitalopram 10 mg daily. Since starting the medication four weeks ago, she feels 'lighter day to day,' though some days she still feels 'on the edge.' She has used hydroxyzine as needed while waiting for the escitalopram to take full effect but has not needed it for the past two and a half weeks. She has not yet tried Buspar, preferring to take new medications when her husband is present. She experiences episodes of anxiety, particularly when home alone without her husband, leading to intrusive thoughts and nervousness. She has been using various tactics to manage her anxiety, such as eating frozen fruit or using a weighted blanket, and has been able to reduce reliance on these methods over time. She feels 'pretty much useless' after her daughter goes to bed due to lingering nervousness but notes improvement in her condition.  She has experienced episodes of tachycardia, but recent Zio patch monitoring showed no abnormalities. Her heart rate ranged from 49 to 134 bpm.  She monitors her blood pressure at home = 100-110s/70s with readings  Her sleep has improved, though she occasionally wakes up several times a night and takes about thirty minutes to fall back asleep. She attributes some disturbances to her husband coming to  bed late.  She is trying to improve her diet, focusing on low sodium options, and has been able to eat normal meals for the past two weeks. She has not yet resumed exercise, waiting for confirmation about her heart health, but plans to start walking on the treadmill at home.  She is aware of her family history of cholesterol issues, as her brother and father have been monitored for it. Her cholesterol levels were checked in early December, showing normal total cholesterol but slightly elevated LDL and triglycerides.  Most recent fall risk assessment:    12/16/2023   10:40 AM  Fall Risk   Falls in the past year? 0  Number falls in past yr: 0  Injury with Fall? 0     Most recent depression screenings:    01/18/2024    8:23 AM 12/16/2023   10:40 AM  PHQ 2/9 Scores  PHQ - 2 Score 0 0  PHQ- 9 Score 2 4      01/18/2024    8:24 AM 12/16/2023   10:40 AM 10/24/2023    8:30 AM  GAD 7 : Generalized Anxiety Score  Nervous, Anxious, on Edge 1 2 0  Control/stop worrying 1 1 0  Worry too much - different things 0 1 0  Trouble relaxing 0 1 0  Restless 0 0 0  Easily annoyed or irritable 0 0 0  Afraid - awful might happen 0 1 0  Total GAD 7 Score 2 6 0  Anxiety Difficulty Not difficult at all Not difficult at all Not difficult at  all      Vision:Within last year and Dental: No current dental problems and Receives regular dental care  Patient Active Problem List   Diagnosis Date Noted   Annual physical exam 01/18/2024   Tachycardia 12/16/2023   Elevated TSH 12/16/2023   Obesity (BMI 30.0-34.9) 10/24/2023   Elevated blood pressure reading in office without diagnosis of hypertension 07/11/2023   Moderate mixed hyperlipidemia not requiring statin therapy 12/30/2022   RUQ pain 07/20/2022   Avitaminosis D 12/29/2021   Fatigue 07/31/2021   Anxiety 01/10/2019   Past Medical History:  Diagnosis Date   Anxiety    Hypothyroidism    Vaccine for human papilloma virus (HPV) types 6, 11, 16,  and 18 administered    Past Surgical History:  Procedure Laterality Date   CHOLECYSTECTOMY  07/2022   WISDOM TOOTH EXTRACTION     x4 extracted    Social History   Tobacco Use   Smoking status: Never   Smokeless tobacco: Never  Vaping Use   Vaping status: Never Used  Substance Use Topics   Alcohol use: Yes    Comment: Once or twice a month   Drug use: No   No Known Allergies    Patient Care Team: Sallee Provencal, FNP as PCP - General (Family Medicine)   Outpatient Medications Prior to Visit  Medication Sig   busPIRone (BUSPAR) 7.5 MG tablet Take 1 tablet (7.5 mg total) by mouth 2 (two) times daily as needed.   escitalopram (LEXAPRO) 10 MG tablet Take 1 tablet (10 mg total) by mouth daily.   hydrOXYzine (ATARAX) 25 MG tablet Take 1 tablet (25 mg total) by mouth 3 (three) times daily as needed for anxiety.   levonorgestrel ([name]) 20 MCG/24HR IUD 1 Intra Uterine Device (1 each total) by Intrauterine route once for 1 dose.   No facility-administered medications prior to visit.    Review of Systems  All other systems reviewed and are negative.      Objective:     BP 133/89   Pulse (!) 112   Ht 5\' 1"  (1.549 m)   Wt 170 lb 14.4 oz (77.5 kg)   SpO2 100%   BMI 32.29 kg/m  BP Readings from Last 3 Encounters:  01/18/24 133/89  12/16/23 (!) 139/105  12/15/23 (!) 127/91   Wt Readings from Last 3 Encounters:  01/18/24 170 lb 14.4 oz (77.5 kg)  12/16/23 175 lb (79.4 kg)  12/14/23 178 lb (80.7 kg)   [name] - 2/14 4 days light - cramping managbale.   Physical Exam Constitutional:      General: She is not in acute distress.    Appearance: Normal appearance. She is overweight. She is not ill-appearing.  HENT:     Head: Normocephalic.     Right Ear: Tympanic membrane normal.     Left Ear: Tympanic membrane normal.     Nose: Nose normal.     Mouth/Throat:     Mouth: Mucous membranes are moist.     Pharynx: Oropharynx is clear. No oropharyngeal exudate or  posterior oropharyngeal erythema.  Eyes:     General: Lids are normal.     Extraocular Movements: Extraocular movements intact.     Right eye: Normal extraocular motion.     Left eye: Normal extraocular motion.     Conjunctiva/sclera: Conjunctivae normal.     Right eye: Right conjunctiva is not injected.     Left eye: Left conjunctiva is not injected.     Pupils: Pupils are  equal, round, and reactive to light.  Neck:     Thyroid: No thyroid mass, thyromegaly or thyroid tenderness.  Cardiovascular:     Rate and Rhythm: Normal rate.     Pulses: Normal pulses.          Radial pulses are 2+ on the right side and 2+ on the left side.       Posterior tibial pulses are 2+ on the right side and 2+ on the left side.     Heart sounds: Normal heart sounds, S1 normal and S2 normal. No murmur heard.    No friction rub. No gallop.  Pulmonary:     Effort: Pulmonary effort is normal. No respiratory distress.     Breath sounds: Normal breath sounds. No stridor. No wheezing, rhonchi or rales.  Chest:  Breasts:    Right: Normal. No swelling, bleeding, inverted nipple, mass, nipple discharge, skin change or tenderness.     Left: Normal. No swelling, bleeding, inverted nipple, mass, nipple discharge, skin change or tenderness.  Abdominal:     General: Bowel sounds are normal. There is no distension.     Palpations: Abdomen is soft. There is no mass.     Tenderness: There is no abdominal tenderness. There is no guarding or rebound.     Hernia: No hernia is present.  Genitourinary:    Comments: UTD on pap, no GYN concerns today Musculoskeletal:        General: No swelling or tenderness. Normal range of motion.     Cervical back: Normal range of motion. No rigidity.  Lymphadenopathy:     Cervical: No cervical adenopathy.     Right cervical: No superficial, deep or posterior cervical adenopathy.    Left cervical: No superficial, deep or posterior cervical adenopathy.     Upper Body:     Right upper  body: No supraclavicular, axillary or pectoral adenopathy.     Left upper body: No supraclavicular, axillary or pectoral adenopathy.  Skin:    General: Skin is warm and dry.     Capillary Refill: Capillary refill takes less than 2 seconds.     Findings: No bruising or erythema.  Neurological:     General: No focal deficit present.     Mental Status: She is alert and oriented to person, place, and time. Mental status is at baseline.     GCS: GCS eye subscore is 4. GCS verbal subscore is 5. GCS motor subscore is 6.     Cranial Nerves: No cranial nerve deficit.     Sensory: No sensory deficit.     Motor: No weakness, tremor or pronator drift.     Coordination: Romberg sign negative.     Gait: Gait is intact. Gait normal.  Psychiatric:        Attention and Perception: Attention and perception normal.        Mood and Affect: Mood and affect normal.        Speech: Speech normal.        Behavior: Behavior normal. Behavior is cooperative.        Thought Content: Thought content normal.        Cognition and Memory: Cognition and memory normal.        Judgment: Judgment normal.      No results found for any visits on 01/18/24.     Assessment & Plan:    Routine Health Maintenance and Physical Exam  Health Maintenance  Topic Date Due   COVID-19 Vaccine (3 -  2024-25 season) 07/24/2023   Flu Shot  02/20/2024*   Pap with HPV screening  12/29/2024   DTaP/Tdap/Td vaccine (10 - Td or Tdap) 02/25/2030   HPV Vaccine  Completed   Hepatitis C Screening  Completed   HIV Screening  Completed  *Topic was postponed. The date shown is not the original due date.    Discussed health benefits of physical activity, and encouraged her to engage in regular exercise appropriate for her age and condition.  Annual physical exam Assessment & Plan: Pt is UTD on pap and labs Discussed referral to [facility] for CBT resources Continue Lexapro at 10mg  daily for two more weeks, it patient feels still irritable  okay to go up to 20mg  daily Make take daily omega 3/fish oil for increase in "good fats"  Things to do to keep yourself healthy  - Exercise at least 30-45 minutes a day, 3-4 days a week.  - Eat a low-fat diet with lots of fruits and vegetables, up to 7-9 servings per day.  - Seatbelts can save your life. Wear them always.  - Smoke detectors on every level of your home, check batteries every year.  - Eye Doctor - have an eye exam every 1-2 years  - Safe sex - if you may be exposed to STDs, use a condom.  - Alcohol -  If you drink, do it moderately, less than 2 drinks per day.  - Health Care Power of Attorney. Choose someone to speak for you if you are not able.  - Depression is common in our stressful world.If you're feeling down or losing interest in things you normally enjoy, please come in for a visit.  - Violence - If anyone is threatening or hurting you, please call immediately.    Anxiety Assessment & Plan: Improved symptoms on Lexapro 10mg  daily.  Discussed potential increase to 20mg  daily if needed.  Catastrophic thoughts and worries persist, suggesting benefit from cognitive behavioral therapy. -Continue Lexapro 10mg  daily. -Consider increasing Lexapro to 20mg  daily if needed. -Refer to [facility] for cognitive behavioral therapy options.  Orders: -     AMB Referral [facility] Care Management  Elevated blood pressure reading in office without diagnosis of hypertension Assessment & Plan: Appears to be related to white coat HTN Home readsings continue to be 100-110s/60s-70 Normotensive Recommend continuing weekly checks Low sodium diet, weight loss, and exercise   Obesity (BMI 30.0-34.9) Assessment & Plan: Down 5lbs since 12/16/23 visit; BMI 32.29 Continue to make efforts for weight loss Continue to make conscious decisions for well balanced diet smaller portions with increase protein, fruits, veggies, water as drink of choice, decrease starches, processed foods, and saturated  fats. Increase weekly exercise - 150 minutes per week.     Tachycardia Assessment & Plan: Zio patch results - no abnormalities in rhythm or rate HR = 49-130s while pt wore devie Symptoms seemed to have settled since starting SSRI, most like anxiety driven at this time May exercise as tolerated.      Return in about 6 months (around 07/17/2024) for Mood and labs.   I, Sallee Provencal, FNP, have reviewed all documentation for this visit. The documentation on 01/18/24 for the exam, diagnosis, procedures, and orders are all accurate and complete.   Sallee Provencal, FNP

## 2024-01-18 NOTE — Assessment & Plan Note (Signed)
 Zio patch results - no abnormalities in rhythm or rate HR = 49-130s while pt wore devie Symptoms seemed to have settled since starting SSRI, most like anxiety driven at this time May exercise as tolerated.

## 2024-01-18 NOTE — Assessment & Plan Note (Addendum)
 Pt is UTD on pap and labs Discussed referral to [facility] for CBT resources Continue Lexapro at 10mg  daily for two more weeks, it patient feels still irritable okay to go up to 20mg  daily Make take daily omega 3/fish oil for increase in "good fats"  Things to do to keep yourself healthy  - Exercise at least 30-45 minutes a day, 3-4 days a week.  - Eat a low-fat diet with lots of fruits and vegetables, up to 7-9 servings per day.  - Seatbelts can save your life. Wear them always.  - Smoke detectors on every level of your home, check batteries every year.  - Eye Doctor - have an eye exam every 1-2 years  - Safe sex - if you may be exposed to STDs, use a condom.  - Alcohol -  If you drink, do it moderately, less than 2 drinks per day.  - Health Care Power of Attorney. Choose someone to speak for you if you are not able.  - Depression is common in our stressful world.If you're feeling down or losing interest in things you normally enjoy, please come in for a visit.  - Violence - If anyone is threatening or hurting you, please call immediately.

## 2024-01-18 NOTE — Assessment & Plan Note (Signed)
 Improved symptoms on Lexapro 10mg  daily.  Discussed potential increase to 20mg  daily if needed.  Catastrophic thoughts and worries persist, suggesting benefit from cognitive behavioral therapy. -Continue Lexapro 10mg  daily. -Consider increasing Lexapro to 20mg  daily if needed. -Refer to [facility] for cognitive behavioral therapy options.

## 2024-01-18 NOTE — Progress Notes (Unsigned)
 Complex Care Management Note Care Guide Note  01/18/2024 Name: Angela Nunez MRN: [id number] DOB: [birth year]   Complex Care Management Outreach Attempts: An unsuccessful telephone outreach was attempted today to offer the patient information about available complex care management services.  Follow Up Plan:  Additional outreach attempts will be made to offer the patient complex care management information and services.   Encounter Outcome:  No Answer  Burman Nieves, CMA, Care Guide [facility]  [facility], [facility] Guide Direct Dial: [phone]  Fax: [phone] Website: [email or web address]

## 2024-01-18 NOTE — Assessment & Plan Note (Signed)
 Appears to be related to white coat HTN Home readsings continue to be 100-110s/60s-70 Normotensive Recommend continuing weekly checks Low sodium diet, weight loss, and exercise

## 2024-01-20 NOTE — Progress Notes (Signed)
 Complex Care Management Note  Care Guide Note 01/20/2024 Name: Angela Nunez MRN: [id number] DOB: [birth year]  [name] is a 33 y.o. year old female who sees Sallee Provencal, FNP for primary care. I reached out to [name] by phone today to offer complex care management services.  [name] was given information about Complex Care Management services today including:   The Complex Care Management services include support from the care team which includes your Nurse Care Manager, Clinical Social Worker, or Pharmacist.  The Complex Care Management team is here to help remove barriers to the health concerns and goals most important to you. Complex Care Management services are voluntary, and the patient may decline or stop services at any time by request to their care team member.   Complex Care Management Consent Status: Patient agreed to services and verbal consent obtained.   Follow up plan:  Telephone appointment with complex care management team member scheduled for:  01/31/2024  Encounter Outcome:  Patient Scheduled  Burman Nieves, CMA, Care Guide [facility]  [facility], [facility] Guide Direct Dial: [phone]  Fax: [phone] Website: [email or web address]

## 2024-01-31 ENCOUNTER — Ambulatory Visit: Payer: Self-pay | Admitting: *Deleted

## 2024-01-31 NOTE — Patient Outreach (Addendum)
 Care Coordination   Initial Visit Note   01/31/2024 Name: Angela Nunez MRN: [id number] DOB: 1991-05-22  Angela Nunez is a 32 y.o. year old female who sees Sallee Provencal, FNP for primary care. I spoke with  Angela Nunez by phone today.  What matters to the patients health and wellness today?  Ongoing mental health follow up to manage symptoms of anxiety.    Goals Addressed             This Visit's Progress    mental health resources to address anxiety        EMOTIONAL / MENTAL HEALTH SUPPORT Call therapist from resources provided to schedule your counseling appointment. [facility] [phone] [facility] [phone] [facility] [phone] Keep all upcoming appointment discussed today Continue with compliance of taking medication prescribed by Doctor Self Support options  (continue to utilize the coping mechanisms discussed today, please contact a therapist from list provided to schedule )         SDOH assessments and interventions completed:  Yes  SDOH Interventions Today    Flowsheet Row Most Recent Value  SDOH Interventions   Food Insecurity Interventions Intervention Not Indicated  Housing Interventions Intervention Not Indicated  Transportation Interventions Intervention Not Indicated  Utilities Interventions Intervention Not Indicated        Care Coordination Interventions:  Yes, provided  Interventions Today    Flowsheet Row Most Recent Value  Chronic Disease   Chronic disease during today's visit Other  [anxiety]  General Interventions   General Interventions Discussed/Reviewed General Interventions Discussed  [Patient assessed for commuity resource/mental health needs. Pt confirms challenges with panic attacks, reports irrational fears of being alone after dark and storms-increased worry about the panic attacks returning, now affecting her  functioning]  Exercise Interventions   Exercise  Discussed/Reviewed Exercise Discussed  [slowly trying to get self back to a excercise routine]  Mental Health Interventions   Mental Health Discussed/Reviewed Mental Health Discussed, Anxiety, Coping Strategies  [adherant to medication, anxiety better with the medicine-coping strategies discussed ie postive self talk, thought stopping tech. use of comfort items, ongoing mental health counseling,]  Nutrition Interventions   Nutrition Discussed/Reviewed Nutrition Discussed  [confirms adjusting diet, making efforts to lower sodium and fat]  Pharmacy Interventions   Pharmacy Dicussed/Reviewed Pharmacy Topics Discussed  [lexapro]  Safety Interventions   Safety Discussed/Reviewed Safety Discussed  [patient denies thoughts of harmto self or others. Patient encouraged to contact [phone] in the event of a mental crisis]       Follow up plan: Follow up call scheduled for 02/14/24    Encounter Outcome:  Patient Visit Completed

## 2024-01-31 NOTE — Patient Instructions (Addendum)
 Visit Information  Thank you for taking time to visit with me today. Please don't hesitate to contact me if I can be of assistance to you.   Following are the goals we discussed today:   Goals Addressed             This Visit's Progress    mental health resources to address anxiety        EMOTIONAL / MENTAL HEALTH SUPPORT Call therapist from resources provided to schedule your counseling appointment. [facility] [phone] [facility] [phone] [facility] [phone] Keep all upcoming appointment discussed today Continue with compliance of taking medication prescribed by Doctor Self Support options  (continue to utilize the coping mechanisms discussed today, please contact a therapist from list provided to schedule )         Our next appointment is by telephone on 02/14/24 at 2:30pm  Please call the care guide team at [phone] if you need to cancel or reschedule your appointment.   If you are experiencing a Mental Health or Behavioral Health Crisis or need someone to talk to, please call the Suicide and Crisis Lifeline: 988   Patient verbalizes understanding of instructions and care plan provided today and agrees to view in MyChart. Active MyChart status and patient understanding of how to access instructions and care plan via MyChart confirmed with patient.     Telephone follow up appointment with care management team member scheduled for: 02/14/24  Verna Czech, LCSW [place]  [facility], [facility] Licensed Clinical Social Worker Care Coordinator  Direct Dial: [phone]

## 2024-02-08 ENCOUNTER — Other Ambulatory Visit: Payer: Self-pay | Admitting: Family Medicine

## 2024-02-08 DIAGNOSIS — F419 Anxiety disorder, unspecified: Secondary | ICD-10-CM

## 2024-02-14 ENCOUNTER — Encounter: Payer: Self-pay | Admitting: *Deleted

## 2024-02-14 ENCOUNTER — Telehealth: Payer: Self-pay | Admitting: *Deleted

## 2024-02-14 NOTE — Patient Outreach (Signed)
 Care Coordination   Follow Up Visit Note   02/14/2024 Name: Angela Nunez MRN: [id number] DOB: Jan 01, 1991  Angela Nunez is a 32 y.o. year old female who sees Sallee Provencal, FNP for primary care. I spoke with  Angela Nunez by phone today.  What matters to the patients health and wellness today?  Patient confirms having no additional community resource needs at this time.    Goals Addressed             This Visit's Progress    mental health resources to address anxiety       Continue follow up with [facility] [phone] for ongoing mental health counseling Keep all upcoming appointments discussed today Continue with compliance of taking medication prescribed by Doctor Contact this social worker with any additional community resource needs         SDOH assessments and interventions completed:  No     Care Coordination Interventions:  Yes, provided  Interventions Today    Flowsheet Row Most Recent Value  Chronic Disease   Chronic disease during today's visit Other  [anxiety]  General Interventions   General Interventions Discussed/Reviewed General Interventions Reviewed, Community Resources  [Follow up phone call from patient to follow up on mental heath needs]  Mental Health Interventions   Mental Health Discussed/Reviewed Mental Health Reviewed, Anxiety  [Confirmed that patient is now active with a therapist through [facility]. Intitial appt completed on 02/03/24. Plan is to follow up 2x per month. Confirmed initial visit went well]  Pharmacy Interventions   Pharmacy Dicussed/Reviewed Pharmacy Topics Discussed  Safety Interventions   Safety Discussed/Reviewed Safety Reviewed  [continued enocuragement to contact [phone] in the event of a mental health crisis]       Follow up plan: No further intervention required.   Encounter Outcome:  Patient Visit Completed

## 2024-02-14 NOTE — Patient Instructions (Signed)
 Visit Information  Thank you for taking time to visit with me today. Please don't hesitate to contact me if I can be of assistance to you.   Following are the goals we discussed today:   Goals Addressed             This Visit's Progress    mental health resources to address anxiety       Continue follow up with [facility] [phone] for ongoing mental health counseling Keep all upcoming appointments discussed today Continue with compliance of taking medication prescribed by Doctor Contact this social worker with any additional community resource needs         If you are experiencing a Mental Health or Behavioral Health Crisis or need someone to talk to, please call the Suicide and Crisis Lifeline: 988   Patient verbalizes understanding of instructions and care plan provided today and agrees to view in [place]. Active MyChart status and patient understanding of how to access instructions and care plan via MyChart confirmed with patient.     No further follow up required: patient to continue to follow up with [facility] for ongoing  mental health counseling and support.  Kindra Bickham, LCSW [place]  [facility], [facility] Licensed Clinical Social Worker Care Coordinator  Direct Dial: [phone]

## 2024-02-14 NOTE — Patient Outreach (Signed)
 Care Coordination   02/14/2024 Name: Angela Nunez MRN: [id number] DOB: [birth year]   Care Coordination Outreach Attempts:  An unsuccessful telephone outreach was attempted today to offer the patient information about available complex care management services.  Follow Up Plan:  Additional outreach attempts will be made to offer the patient complex care management information and services.   Encounter Outcome:  No Answer   Care Coordination Interventions:  No, not indicated    Abelina Ketron, LCSW [place]  [facility], [facility] Licensed Clinical Social Worker Care Coordinator  Direct Dial: [phone]

## 2024-02-22 ENCOUNTER — Encounter: Payer: Self-pay | Admitting: Family Medicine

## 2024-05-07 ENCOUNTER — Encounter: Payer: Self-pay | Admitting: Family Medicine

## 2024-07-17 ENCOUNTER — Ambulatory Visit: Payer: No Typology Code available for payment source | Admitting: Family Medicine

## 2024-07-17 VITALS — BP 129/88 | HR 88 | Ht 61.0 in | Wt 184.0 lb

## 2024-07-17 DIAGNOSIS — R5383 Other fatigue: Secondary | ICD-10-CM

## 2024-07-17 DIAGNOSIS — E66811 Obesity, class 1: Secondary | ICD-10-CM

## 2024-07-17 DIAGNOSIS — Z975 Presence of (intrauterine) contraceptive device: Secondary | ICD-10-CM

## 2024-07-17 DIAGNOSIS — E782 Mixed hyperlipidemia: Secondary | ICD-10-CM | POA: Diagnosis not present

## 2024-07-17 DIAGNOSIS — R030 Elevated blood-pressure reading, without diagnosis of hypertension: Secondary | ICD-10-CM | POA: Diagnosis not present

## 2024-07-17 NOTE — Progress Notes (Signed)
 Established Patient Office Visit  Introduced to nurse practitioner role and practice setting.  All questions answered.  Discussed provider/patient relationship and expectations.  Subjective   Patient ID: Angela Nunez, female    DOB: Nov 16, 1991  Age: 33 y.o. MRN: [id number]  No chief complaint on file.   Discussed the use of AI scribe software for clinical note transcription with the patient, who gave verbal consent to proceed.  History of Present Illness Angela Nunez is a 33 year old female who presents for follow-up of anxiety management.  Her anxiety symptoms have improved since the last visit, with only one or two episodes of heart racing, which she managed by stopping activities and using techniques learned in therapy. These episodes resolved more quickly than previous ones.  She is currently taking Lexapro  10 mg daily and finds it effective. She has not needed to use her as-needed medication recently, although she carries it with her but is hesitant to take it. She has paused therapy due to cost and family expenses but found it helpful. She continues to use techniques learned in therapy, such as breathing exercises, to manage symptoms.  She experiences difficulty waking up in the morning despite going to bed around 10 PM. She sets multiple alarms but often does not hear them. Previously a morning person, she is unsure if this change is related to her medication. She no longer consumes caffeine due to anxiety.  She has an IUD in place and is curious about the potential impact of anxiety on pre-menopausal symptoms.  Her blood pressure readings at home have been normal, but she has not checked it recently. She is trying to walk more and is curious about her current blood pressure reading.      07/17/2024    8:14 AM 01/31/2024    2:29 PM 01/18/2024    8:23 AM  Depression screen PHQ 2/9  Decreased Interest 0 0 0  Down, Depressed, Hopeless 0 0 0  PHQ - 2 Score 0 0 0   Altered sleeping 0  1  Tired, decreased energy 1  1  Change in appetite 0  0  Feeling bad or failure about yourself  0  0  Trouble concentrating 0  0  Moving slowly or fidgety/restless 0  0  Suicidal thoughts 0  0  PHQ-9 Score 1  2  Difficult doing work/chores Not difficult at all  Not difficult at all       07/17/2024    8:14 AM 01/18/2024    8:24 AM 12/16/2023   10:40 AM 10/24/2023    8:30 AM  GAD 7 : Generalized Anxiety Score  Nervous, Anxious, on Edge 0 1 2 0  Control/stop worrying 0 1 1 0  Worry too much - different things 0 0 1 0  Trouble relaxing 0 0 1 0  Restless 0 0 0 0  Easily annoyed or irritable 0 0 0 0  Afraid - awful might happen 0 0 1 0  Total GAD 7 Score 0 2 6 0  Anxiety Difficulty Not difficult at all Not difficult at all Not difficult at all Not difficult at all     ROS  Negative unless indicated in HPI   Objective:     BP 129/88 (BP Location: Right Arm, Patient Position: Sitting, Cuff Size: Normal)   Pulse 88   Wt 184 lb (83.5 kg)   SpO2 100%   BMI 34.77 kg/m    Physical Exam Constitutional:  General: She is not in acute distress.    Appearance: Normal appearance. She is obese. She is not toxic-appearing or diaphoretic.  HENT:     Head: Normocephalic.     Nose: Nose normal.     Mouth/Throat:     Mouth: Mucous membranes are moist.     Pharynx: Oropharynx is clear.  Eyes:     Extraocular Movements: Extraocular movements intact.     Pupils: Pupils are equal, round, and reactive to light.  Cardiovascular:     Rate and Rhythm: Normal rate and regular rhythm.     Pulses: Normal pulses.     Heart sounds: Normal heart sounds. No murmur heard.    No friction rub. No gallop.  Pulmonary:     Effort: No respiratory distress.     Breath sounds: No stridor. No wheezing, rhonchi or rales.  Chest:     Chest wall: No tenderness.  Musculoskeletal:     Right lower leg: No edema.     Left lower leg: No edema.  Skin:    General: Skin is warm and  dry.     Capillary Refill: Capillary refill takes less than 2 seconds.  Neurological:     General: No focal deficit present.     Mental Status: She is alert and oriented to person, place, and time. Mental status is at baseline.  Psychiatric:        Mood and Affect: Mood normal.        Behavior: Behavior normal.        Thought Content: Thought content normal.        Judgment: Judgment normal.      No results found for any visits on 07/17/24.    The ASCVD Risk score (Arnett DK, et al., 2019) failed to calculate for the following reasons:   The 2019 ASCVD risk score is only valid for ages 83 to 31    Assessment & Plan:  Moderate mixed hyperlipidemia not requiring statin therapy -     Lipid panel  Obesity (BMI 30.0-34.9) -     Basic metabolic panel with GFR -     CBC  Elevated blood-pressure reading without diagnosis of hypertension  IUD (intrauterine device) in place     Assessment and Plan Assessment & Plan Generalized anxiety disorder Generalized anxiety disorder is well-managed with Lexapro  10 mg. She reports only one episode of heart racing in the past few months, controlled using therapy techniques. CBT is paused due to cost, and she uses coping strategies provided by her therapist. She has not needed her as-needed medication and is hesitant to take it.  - Continue Lexapro  10 mg daily. - Continue prn CBT - Continue prn buspar   Elevated blood pressure, office readings  Blood pressure readings in the office show mild elevation, most likely white coat.  Home readings are normal, 100-110s/70  Lifestyle modifications are emphasized as the primary intervention. - Continue monitoring blood pressure at home once a week. - Encourage lifestyle modifications, including regular exercise and dietary changes to reduce sodium intake. - CMP  BMI = 34.77 Continue to make conscious decisions for well balanced diet smaller portions with increase protein, fruits, veggies, water as  drink of choice, decrease starches, processed foods, and saturated fats. Increase weekly exercise - 150 minutes per week.  - CMP, CBC, Lipid  Fatigue, difficulty waking in the morning Reports difficulty waking up in the morning despite going to bed at a reasonable time. Takes Lexapro  in the morning and is  unsure if it contributes to her fatigue. Advised to adjust bedtime and morning routine to improve wakefulness. - Could be lack of motivation to wake up, mood related. - Adjust bedtime to 9:30 PM to improve morning wakefulness. - Consider setting a firm wake-up time and avoiding the snooze button. - CMP, CBC, Lipid  Long-acting reversible contraception, IUD in situ The IUD is in place and functioning as intended. No issues or concerns were reported regarding the IUD. - Continue with current IUD.   Return in about 6 months (around 01/17/2025) for Mood.   I, Curtis DELENA Boom, FNP, have reviewed all documentation for this visit. The documentation on 07/17/24 for the exam, diagnosis, procedures, and orders are all accurate and complete.   Curtis DELENA Boom, FNP

## 2024-07-18 ENCOUNTER — Ambulatory Visit: Payer: Self-pay | Admitting: Family Medicine

## 2024-07-18 LAB — BASIC METABOLIC PANEL WITH GFR
BUN/Creatinine Ratio: 19 (ref 9–23)
BUN: 15 mg/dL (ref 6–20)
CO2: 21 mmol/L (ref 20–29)
Calcium: 9.9 mg/dL (ref 8.7–10.2)
Chloride: 100 mmol/L (ref 96–106)
Creatinine, Ser: 0.81 mg/dL (ref 0.57–1.00)
Glucose: 86 mg/dL (ref 70–99)
Potassium: 5 mmol/L (ref 3.5–5.2)
Sodium: 139 mmol/L (ref 134–144)
eGFR: 99 mL/min/1.73 (ref >59)

## 2024-07-18 LAB — CBC
Hematocrit: 45.2 % (ref 34.0–46.6)
Hemoglobin: 14.9 g/dL (ref 11.1–15.9)
MCH: 29.2 pg (ref 26.6–33.0)
MCHC: 33 g/dL (ref 31.5–35.7)
MCV: 89 fL (ref 79–97)
Platelets: 242 x10E3/uL (ref 150–450)
RBC: 5.1 x10E6/uL (ref 3.77–5.28)
RDW: 12.2 % (ref 11.7–15.4)
WBC: 7.4 x10E3/uL (ref 3.4–10.8)

## 2024-07-18 LAB — LIPID PANEL
Chol/HDL Ratio: 4.3 ratio (ref 0.0–4.4)
Cholesterol, Total: 228 mg/dL — ABNORMAL HIGH (ref 100–199)
HDL: 53 mg/dL (ref >39)
LDL Chol Calc (NIH): 158 mg/dL — ABNORMAL HIGH (ref 0–99)
Triglycerides: 97 mg/dL (ref 0–149)
VLDL Cholesterol Cal: 17 mg/dL (ref 5–40)

## 2024-12-03 ENCOUNTER — Other Ambulatory Visit: Payer: Self-pay

## 2024-12-03 ENCOUNTER — Telehealth: Payer: Self-pay | Admitting: Family Medicine

## 2024-12-03 DIAGNOSIS — F419 Anxiety disorder, unspecified: Secondary | ICD-10-CM

## 2024-12-03 NOTE — Telephone Encounter (Signed)
 LRF 02/08/24 90 x 3

## 2024-12-03 NOTE — Telephone Encounter (Signed)
[facility] faxed refill request for the following medications: ° °escitalopram (LEXAPRO) 10 MG tablet  ° °Please advise. ° °

## 2024-12-03 NOTE — Telephone Encounter (Signed)
 Converted

## 2024-12-05 ENCOUNTER — Other Ambulatory Visit: Payer: Self-pay

## 2024-12-05 DIAGNOSIS — F419 Anxiety disorder, unspecified: Secondary | ICD-10-CM

## 2024-12-05 MED ORDER — ESCITALOPRAM OXALATE 10 MG PO TABS: 10.0000 mg | ORAL_TABLET | Freq: Every day | ORAL | 3 refills | Status: AC

## 2024-12-05 NOTE — Telephone Encounter (Signed)
 Done

## 2025-01-17 ENCOUNTER — Ambulatory Visit
